# Patient Record
Sex: Male | Born: 1970 | Race: White | Hispanic: No | Marital: Married | Smoking: Current every day smoker
Health system: Southern US, Community
[De-identification: ages and names within clinical notes are randomized; demographics above are authoritative.]

## PROBLEM LIST (undated history)

## (undated) DIAGNOSIS — G4730 Sleep apnea, unspecified: Secondary | ICD-10-CM

## (undated) DIAGNOSIS — E119 Type 2 diabetes mellitus without complications: Secondary | ICD-10-CM

## (undated) DIAGNOSIS — F32A Depression, unspecified: Secondary | ICD-10-CM

## (undated) DIAGNOSIS — F419 Anxiety disorder, unspecified: Secondary | ICD-10-CM

## (undated) HISTORY — PX: COLONOSCOPY: SHX174

## (undated) HISTORY — PX: HAND SURGERY: SHX662

## (undated) HISTORY — PX: KNEE SURGERY: SHX244

## (undated) HISTORY — PX: COLECTOMY: SHX59

---

## 2001-09-28 DIAGNOSIS — G4733 Obstructive sleep apnea (adult) (pediatric): Secondary | ICD-10-CM | POA: Insufficient documentation

## 2004-10-22 DIAGNOSIS — M8580 Other specified disorders of bone density and structure, unspecified site: Secondary | ICD-10-CM | POA: Insufficient documentation

## 2005-12-06 ENCOUNTER — Encounter: Admission: RE | Admit: 2005-12-06 | Discharge: 2005-12-06 | Payer: Self-pay | Admitting: Occupational Medicine

## 2006-09-26 DIAGNOSIS — K5090 Crohn's disease, unspecified, without complications: Secondary | ICD-10-CM

## 2006-09-26 HISTORY — DX: Crohn's disease, unspecified, without complications: K50.90

## 2011-10-20 DIAGNOSIS — K5090 Crohn's disease, unspecified, without complications: Secondary | ICD-10-CM | POA: Insufficient documentation

## 2014-02-24 DIAGNOSIS — E8881 Metabolic syndrome: Secondary | ICD-10-CM | POA: Insufficient documentation

## 2014-02-24 DIAGNOSIS — M5030 Other cervical disc degeneration, unspecified cervical region: Secondary | ICD-10-CM | POA: Insufficient documentation

## 2015-09-23 DIAGNOSIS — E119 Type 2 diabetes mellitus without complications: Secondary | ICD-10-CM | POA: Insufficient documentation

## 2015-09-23 DIAGNOSIS — Z794 Long term (current) use of insulin: Secondary | ICD-10-CM | POA: Insufficient documentation

## 2017-04-13 DIAGNOSIS — K760 Fatty (change of) liver, not elsewhere classified: Secondary | ICD-10-CM | POA: Insufficient documentation

## 2017-04-13 DIAGNOSIS — K7460 Unspecified cirrhosis of liver: Secondary | ICD-10-CM | POA: Insufficient documentation

## 2017-05-01 DIAGNOSIS — K3189 Other diseases of stomach and duodenum: Secondary | ICD-10-CM | POA: Insufficient documentation

## 2017-09-18 DIAGNOSIS — E785 Hyperlipidemia, unspecified: Secondary | ICD-10-CM | POA: Insufficient documentation

## 2018-09-26 HISTORY — PX: CYSTOSCOPY: SUR368

## 2018-10-03 DIAGNOSIS — D3615 Benign neoplasm of peripheral nerves and autonomic nervous system of abdomen: Secondary | ICD-10-CM | POA: Insufficient documentation

## 2019-04-07 DIAGNOSIS — R162 Hepatomegaly with splenomegaly, not elsewhere classified: Secondary | ICD-10-CM | POA: Insufficient documentation

## 2019-10-23 DIAGNOSIS — M5412 Radiculopathy, cervical region: Secondary | ICD-10-CM | POA: Insufficient documentation

## 2020-04-09 DIAGNOSIS — I422 Other hypertrophic cardiomyopathy: Secondary | ICD-10-CM | POA: Insufficient documentation

## 2021-07-13 ENCOUNTER — Ambulatory Visit (INDEPENDENT_AMBULATORY_CARE_PROVIDER_SITE_OTHER): Payer: Medicare HMO | Admitting: Urology

## 2021-07-13 ENCOUNTER — Other Ambulatory Visit: Payer: Self-pay

## 2021-07-13 ENCOUNTER — Ambulatory Visit (HOSPITAL_COMMUNITY)
Admission: RE | Admit: 2021-07-13 | Discharge: 2021-07-13 | Disposition: A | Discharge status: Home / Self Care | Payer: Medicare HMO | Source: Ambulatory Visit | Attending: Urology | Admitting: Urology

## 2021-07-13 ENCOUNTER — Encounter: Payer: Self-pay | Admitting: Urology

## 2021-07-13 VITALS — BP 136/81 | HR 73 | Wt 220.0 lb

## 2021-07-13 DIAGNOSIS — N200 Calculus of kidney: Secondary | ICD-10-CM

## 2021-07-13 LAB — URINALYSIS, ROUTINE W REFLEX MICROSCOPIC
Bilirubin, UA: NEGATIVE
Ketones, UA: NEGATIVE
Leukocytes,UA: NEGATIVE
Nitrite, UA: NEGATIVE
Protein,UA: NEGATIVE
Specific Gravity, UA: 1.015 (ref 1.005–1.030)
Urobilinogen, Ur: 1 mg/dL (ref 0.2–1.0)
pH, UA: 7 (ref 5.0–7.5)

## 2021-07-13 LAB — MICROSCOPIC EXAMINATION
Bacteria, UA: NONE SEEN
Epithelial Cells (non renal): NONE SEEN /hpf (ref 0–10)
RBC, Urine: >30 /hpf — AB (ref 0–2)
Renal Epithel, UA: NONE SEEN /hpf
WBC, UA: NONE SEEN /hpf (ref 0–5)

## 2021-07-13 MED ORDER — TAMSULOSIN HCL 0.4 MG PO CAPS
0.4000 mg | ORAL_CAPSULE | Freq: Every day | ORAL | 11 refills | Status: DC
Start: 2021-07-13 — End: 2021-11-10

## 2021-07-13 NOTE — H&P (View-Only) (Signed)
 07/13/2021 1:58 PM   Thomas Alexander 04/24/1971 [id number]  Referring provider: No referring provider defined for this encounter.  nephrolithiasis   HPI: Thomas Alexander is 50yo here for evaluation of nephrolithiasis. He has been makes stones for the past 4-5 years. He was seen at [facility] and was started on potassium citrate and magnesium. This has not decreased the stone formation rate. He passes 2-3 stones per week. He has intermittent bilateral flank pain. NO fevers. IPSS 13 QOL 6. He takes flomax intermittently. KUB from today shows numerous bilateral renal calculi and multiple distal right ureteral calculi.    PMH: No past medical history on file.  Surgical History:   Home Medications:  Allergies as of 07/13/2021       Reactions   Nsaids Other (See Comments)   Crohns Disease; takes Humira   Fish Oil Other (See Comments)   Other Reaction: BURNING IN THROAT, STOMACH        Medication List        Accurate as of July 13, 2021  1:58 PM. If you have any questions, ask your nurse or doctor.          atenolol 50 MG tablet Commonly known as: TENORMIN Take by mouth.   calcium carbonate 1250 (500 Ca) MG chewable tablet Commonly known as: OS-CAL Chew by mouth.   citalopram 20 MG tablet Commonly known as: CELEXA citalopram 20 mg tablet  Take 1 tablet every day by oral route.   cyanocobalamin 1000 MCG/ML injection Commonly known as: (VITAMIN B-12) Inject 1,000 mcg into the muscle every 30 (thirty) days.   dicyclomine 10 MG capsule Commonly known as: BENTYL Take by mouth.   Humira Pen 40 MG/0.4ML Pnkt Generic drug: Adalimumab Humira(CF) Pen 40 mg/0.4 mL subcutaneous kit   Jardiance 25 MG Tabs tablet Generic drug: empagliflozin Take 25 mg by mouth every morning.   lovastatin 10 MG tablet Commonly known as: MEVACOR Take 10 mg by mouth at bedtime.   TAMSULOSIN HCL PO Take by mouth.   UltiCare Tuberculin Safety Syr 25G X 5/8" 1 ML Misc Generic  drug: TUBERCULIN SYR 1CC/25GX5/8"        Allergies:  Allergies  Allergen Reactions   Nsaids Other (See Comments)    Crohns Disease; takes Humira   Fish Oil Other (See Comments)    Other Reaction: BURNING IN THROAT, STOMACH    Family History: No family history on file.  Social History:  has no history on file for tobacco use, alcohol use, and drug use.  ROS: All other review of systems were reviewed and are negative except what is noted above in HPI  Physical Exam: BP 136/81   Pulse 73   Wt 220 lb (99.8 kg)   Constitutional:  Alert and oriented, No acute distress. HEENT: [place] AT, moist mucus membranes.  Trachea midline, no masses. Cardiovascular: No clubbing, cyanosis, or edema. Respiratory: Normal respiratory effort, no increased work of breathing. GI: Abdomen is soft, nontender, nondistended, no abdominal masses GU: No CVA tenderness.  Lymph: No cervical or inguinal lymphadenopathy. Skin: No rashes, bruises or suspicious lesions. Neurologic: Grossly intact, no focal deficits, moving all 4 extremities. Psychiatric: Normal mood and affect.  Laboratory Data: No results found for: WBC, HGB, HCT, MCV, PLT  No results found for: CREATININE  No results found for: PSA  No results found for: TESTOSTERONE  No results found for: HGBA1C  Urinalysis No results found for: COLORURINE, APPEARANCEUR, LABSPEC, PHURINE, GLUCOSEU, HGBUR, BILIRUBINUR, KETONESUR, PROTEINUR, UROBILINOGEN, NITRITE, LEUKOCYTESUR    No results found for: LABMICR, [place], RBCUA, LABEPIT, MUCUS, BACTERIA  Pertinent Imaging: KUB today: Images reviewed and discussed with the patient No results found for this or any previous visit.  No results found for this or any previous visit.  No results found for this or any previous visit.  No results found for this or any previous visit.  No results found for this or any previous visit.  No results found for this or any previous visit.  No results found for  this or any previous visit.  No results found for this or any previous visit.   Assessment & Plan:    1. Kidney stones -We discussed the management of kidney stones. These options include observation, ureteroscopy, shockwave lithotripsy (ESWL) and percutaneous nephrolithotomy (PCNL). We discussed which options are relevant to the patient's stone(s). We discussed the natural history of kidney stones as well as the complications of untreated stones and the impact on quality of life without treatment as well as with each of the above listed treatments. We also discussed the efficacy of each treatment in its ability to clear the stone burden. With any of these management options I discussed the signs and symptoms of infection and the need for emergent treatment should these be experienced. For each option we discussed the ability of each procedure to clear the patient of their stone burden.   For observation I described the risks which include but are not limited to silent renal damage, life-threatening infection, need for emergent surgery, failure to pass stone and pain.   For ureteroscopy I described the risks which include bleeding, infection, damage to contiguous structures, positioning injury, ureteral stricture, ureteral avulsion, ureteral injury, need for prolonged ureteral stent, inability to perform ureteroscopy, need for an interval procedure, inability to clear stone burden, stent discomfort/pain, heart attack, stroke, pulmonary embolus and the inherent risks with general anesthesia.   For shockwave lithotripsy I described the risks which include arrhythmia, kidney contusion, kidney hemorrhage, need for transfusion, pain, inability to adequately break up stone, inability to pass stone fragments, Steinstrasse, infection associated with obstructing stones, need for alternate surgical procedure, need for repeat shockwave lithotripsy, MI, CVA, PE and the inherent risks with anesthesia/conscious  sedation.   For PCNL I described the risks including positioning injury, pneumothorax, hydrothorax, need for chest tube, inability to clear stone burden, renal laceration, arterial venous fistula or malformation, need for embolization of kidney, loss of kidney or renal function, need for repeat procedure, need for prolonged nephrostomy tube, ureteral avulsion, MI, CVA, PE and the inherent risks of general anesthesia.   - The patient would like to proceed with Right ureteroscopic stone extraction and then staged left ureteroscopic stone extraction. - Urinalysis, Routine w reflex microscopic   No follow-ups on file.  Nicolette Bang, MD  [facility] [place]

## 2021-07-13 NOTE — Patient Instructions (Signed)
Ureteroscopy Ureteroscopy is a procedure to check for and treat problems inside part of the urinary tract. In this procedure, a thin, flexible tube with a light at the end (ureteroscope) is used to look at the inside of the kidneys and the ureters. The ureters are the tubes that carry urine from the kidneys to the bladder. The ureteroscope isinserted into one or both of the ureters. You may need this procedure if you have frequent urinary tract infections (UTIs), blood in your urine, or a stone in one of your ureters. A ureteroscopy can be done: To find the cause of urine blockage in a ureter and to evaluate other abnormalities inside the ureters or kidneys. To remove stones. To remove or treat growths of tissue (polyps), abnormal tissue, and some types of tumors. To remove a tissue sample and check it for disease under a microscope (biopsy). Tell a health care provider about: Any allergies you have. All medicines you are taking, including vitamins, herbs, eye drops, creams, and over-the-counter medicines. Any problems you or family members have had with anesthetic medicines. Any blood disorders you have. Any surgeries you have had. Any medical conditions you have. Whether you are pregnant or may be pregnant. What are the risks? Generally, this is a safe procedure. However, problems may occur, including: Bleeding. Infection. Allergic reactions to medicines. Scarring that narrows the ureter (stricture). Creating a hole in the ureter (perforation). What happens before the procedure? Staying hydrated Follow instructions from your health care provider about hydration, which may include: Up to 2 hours before the procedure - you may continue to drink clear liquids, such as water, clear fruit juice, black coffee, and plain tea.  Eating and drinking restrictions Follow instructions from your health care provider about eating and drinking, which may include: 8 hours before the procedure - stop  eating heavy meals or foods, such as meat, fried foods, or fatty foods. 6 hours before the procedure - stop eating light meals or foods, such as toast or cereal. 6 hours before the procedure - stop drinking milk or drinks that contain milk. 2 hours before the procedure - stop drinking clear liquids. Medicines Ask your health care provider about: Changing or stopping your regular medicines. This is especially important if you are taking diabetes medicines or blood thinners. Taking medicines such as aspirin and ibuprofen. These medicines can thin your blood. Do not take these medicines unless your health care provider tells you to take them. Taking over-the-counter medicines, vitamins, herbs, and supplements. General instructions Do not use any products that contain nicotine or tobacco for at least 4 weeks before the procedure. These products include cigarettes, e-cigarettes, and chewing tobacco. If you need help quitting, ask your health care provider. You may have a urine sample taken to check for infection. Plan to have someone take you home from the hospital or clinic. If you will be going home right after the procedure, plan to have someone with you for 24 hours. Ask your health care provider what steps will be taken to help prevent infection. These may include: Washing skin with a germ-killing soap. Receiving antibiotic medicine. What happens during the procedure?  An IV will be inserted into one of your veins. You will be given one or more of the following: A medicine to help you relax (sedative). A medicine to make you fall asleep (general anesthetic). A medicine that is injected into your spine to numb the area below and slightly above the injection site (spinal anesthetic). The part   of your body that drains urine from your bladder (urethra) will be cleaned with a germ-killing solution. The ureteroscope will be passed through your urethra into your bladder. A salt-water solution will  be sent through the ureteroscope to fill your bladder. This will help the health care provider see the openings of your ureters more clearly. The ureteroscope will be passed into your ureter. If a growth is found, a biopsy may be done. If a stone is found, it may be removed through the ureteroscope, or the stone may be broken up using a laser, shock waves, or electrical energy. In some cases, if the ureter is too small, a tube may be inserted that keeps the ureter open (ureteral stent). The stent may be left in place for 1 or 2 weeks to keep the ureter open, and then the ureteroscopy procedure will be done. The scope will be removed, and your bladder will be emptied. The procedure may vary among health care providers and hospitals. What can I expect after the procedure? After your procedure, it is common to have: Your blood pressure, heart rate, breathing rate, and blood oxygen level monitored until you leave the hospital or clinic. A burning sensation when you urinate. You may be asked to urinate. Blood in your urine. Mild discomfort in your bladder area or kidney area when urinating. A need to urinate more often or urgently. Follow these instructions at home: Medicines Take over-the-counter and prescription medicines only as told by your health care provider. If you were prescribed an antibiotic medicine, take it as told by your health care provider. Do not stop taking the antibiotic even if you start to feel better. General instructions  If you were given a sedative during the procedure, it can affect you for several hours. Do not drive or operate machinery until your health care provider says that it is safe. To relieve burning, take a warm bath or hold a warm washcloth over your groin. Drink enough fluid to keep your urine pale yellow. Drink two 8-ounce (237 mL) glasses of water every hour for the first 2 hours after you get home. Continue to drink water often at home. You can eat what  you normally do. Keep all follow-up visits as told by your health care provider. This is important. If you had a ureteral stent placed, ask your health care provider when you need to return to have it removed.  Contact a health care provider if you have: Chills or a fever. Burning pain for longer than 24 hours after the procedure. Blood in your urine for longer than 24 hours after the procedure. Get help right away if you have: Large amounts of blood in your urine. Blood clots in your urine. Severe pain. Chest pain or trouble breathing. The feeling of a full bladder and you are unable to urinate. These symptoms may represent a serious problem that is an emergency. Do not wait to see if the symptoms will go away. Get medical help right away. Call your local emergency services (911 in the [place] Summary Ureteroscopy is a procedure to check for and treat problems inside part of the urinary tract. In this procedure, a thin, flexible tube with a light at the end (ureteroscope) is used to look at the inside of the kidneys and the ureters. You may need this procedure if you have frequent urinary tract infections (UTIs), blood in your urine, or a stone in a ureter. This information is not intended to replace advice given to   you by your health care provider. Make sure you discuss any questions you have with your healthcare provider. Document Revised: 06/19/2019 Document Reviewed: 06/19/2019 Elsevier Patient Education  2022 [employer] Inc.  

## 2021-07-13 NOTE — Progress Notes (Signed)
07/13/2021 1:58 PM   Thomas Alexander May 25, 1971 [id number]  Referring provider: No referring provider defined for this encounter.  nephrolithiasis   HPI: Thomas Alexander is 50yo here for evaluation of nephrolithiasis. He has been makes stones for the past 4-5 years. He was seen at [facility] and was started on potassium citrate and magnesium. This has not decreased the stone formation rate. He passes 2-3 stones per week. He has intermittent bilateral flank pain. NO fevers. IPSS 13 QOL 6. He takes flomax intermittently. KUB from today shows numerous bilateral renal calculi and multiple distal right ureteral calculi.    PMH: No past medical history on file.  Surgical History:   Home Medications:  Allergies as of 07/13/2021       Reactions   Nsaids Other (See Comments)   Crohns Disease; takes Humira   Fish Oil Other (See Comments)   Other Reaction: BURNING IN THROAT, STOMACH        Medication List        Accurate as of July 13, 2021  1:58 PM. If you have any questions, ask your nurse or doctor.          atenolol 50 MG tablet Commonly known as: TENORMIN Take by mouth.   calcium carbonate 1250 (500 Ca) MG chewable tablet Commonly known as: OS-CAL Chew by mouth.   citalopram 20 MG tablet Commonly known as: CELEXA citalopram 20 mg tablet  Take 1 tablet every day by oral route.   cyanocobalamin 1000 MCG/ML injection Commonly known as: (VITAMIN B-12) Inject 1,000 mcg into the muscle every 30 (thirty) days.   dicyclomine 10 MG capsule Commonly known as: BENTYL Take by mouth.   Humira Pen 40 MG/0.4ML Pnkt Generic drug: Adalimumab Humira(CF) Pen 40 mg/0.4 mL subcutaneous kit   Jardiance 25 MG Tabs tablet Generic drug: empagliflozin Take 25 mg by mouth every morning.   lovastatin 10 MG tablet Commonly known as: MEVACOR Take 10 mg by mouth at bedtime.   TAMSULOSIN HCL PO Take by mouth.   UltiCare Tuberculin Safety Syr 25G X 5/8" 1 ML Misc Generic  drug: TUBERCULIN SYR 1CC/25GX5/8"        Allergies:  Allergies  Allergen Reactions   Nsaids Other (See Comments)    Crohns Disease; takes Humira   Fish Oil Other (See Comments)    Other Reaction: BURNING IN THROAT, STOMACH    Family History: No family history on file.  Social History:  has no history on file for tobacco use, alcohol use, and drug use.  ROS: All other review of systems were reviewed and are negative except what is noted above in HPI  Physical Exam: BP 136/81   Pulse 73   Wt 220 lb (99.8 kg)   Constitutional:  Alert and oriented, No acute distress. HEENT: [place] AT, moist mucus membranes.  Trachea midline, no masses. Cardiovascular: No clubbing, cyanosis, or edema. Respiratory: Normal respiratory effort, no increased work of breathing. GI: Abdomen is soft, nontender, nondistended, no abdominal masses GU: No CVA tenderness.  Lymph: No cervical or inguinal lymphadenopathy. Skin: No rashes, bruises or suspicious lesions. Neurologic: Grossly intact, no focal deficits, moving all 4 extremities. Psychiatric: Normal mood and affect.  Laboratory Data: No results found for: WBC, HGB, HCT, MCV, PLT  No results found for: CREATININE  No results found for: PSA  No results found for: TESTOSTERONE  No results found for: HGBA1C  Urinalysis No results found for: COLORURINE, APPEARANCEUR, LABSPEC, [place], [place], [place], [place], KETONESUR, PROTEINUR, [place], NITRITE, LEUKOCYTESUR  No results found for: LABMICR, [place], RBCUA, LABEPIT, MUCUS, BACTERIA  Pertinent Imaging: KUB today: Images reviewed and discussed with the patient No results found for this or any previous visit.  No results found for this or any previous visit.  No results found for this or any previous visit.  No results found for this or any previous visit.  No results found for this or any previous visit.  No results found for this or any previous visit.  No results found for  this or any previous visit.  No results found for this or any previous visit.   Assessment & Plan:    1. Kidney stones -We discussed the management of kidney stones. These options include observation, ureteroscopy, shockwave lithotripsy (ESWL) and percutaneous nephrolithotomy (PCNL). We discussed which options are relevant to the patient's stone(s). We discussed the natural history of kidney stones as well as the complications of untreated stones and the impact on quality of life without treatment as well as with each of the above listed treatments. We also discussed the efficacy of each treatment in its ability to clear the stone burden. With any of these management options I discussed the signs and symptoms of infection and the need for emergent treatment should these be experienced. For each option we discussed the ability of each procedure to clear the patient of their stone burden.   For observation I described the risks which include but are not limited to silent renal damage, life-threatening infection, need for emergent surgery, failure to pass stone and pain.   For ureteroscopy I described the risks which include bleeding, infection, damage to contiguous structures, positioning injury, ureteral stricture, ureteral avulsion, ureteral injury, need for prolonged ureteral stent, inability to perform ureteroscopy, need for an interval procedure, inability to clear stone burden, stent discomfort/pain, heart attack, stroke, pulmonary embolus and the inherent risks with general anesthesia.   For shockwave lithotripsy I described the risks which include arrhythmia, kidney contusion, kidney hemorrhage, need for transfusion, pain, inability to adequately break up stone, inability to pass stone fragments, Steinstrasse, infection associated with obstructing stones, need for alternate surgical procedure, need for repeat shockwave lithotripsy, MI, CVA, PE and the inherent risks with anesthesia/conscious  sedation.   For PCNL I described the risks including positioning injury, pneumothorax, hydrothorax, need for chest tube, inability to clear stone burden, renal laceration, arterial venous fistula or malformation, need for embolization of kidney, loss of kidney or renal function, need for repeat procedure, need for prolonged nephrostomy tube, ureteral avulsion, MI, CVA, PE and the inherent risks of general anesthesia.   - The patient would like to proceed with Right ureteroscopic stone extraction and then staged left ureteroscopic stone extraction. - Urinalysis, Routine w reflex microscopic   No follow-ups on file.  Nicolette Bang, MD  [facility] [place]

## 2021-07-13 NOTE — H&P (View-Only) (Signed)
 07/13/2021 1:58 PM   Thomas Alexander 07/10/1971 [id number]  Referring provider: No referring provider defined for this encounter.  nephrolithiasis   HPI: Thomas Alexander is 50yo here for evaluation of nephrolithiasis. He has been makes stones for the past 4-5 years. He was seen at [facility] and was started on potassium citrate and magnesium. This has not decreased the stone formation rate. He passes 2-3 stones per week. He has intermittent bilateral flank pain. NO fevers. IPSS 13 QOL 6. He takes flomax intermittently. KUB from today shows numerous bilateral renal calculi and multiple distal right ureteral calculi.    PMH: No past medical history on file.  Surgical History:   Home Medications:  Allergies as of 07/13/2021       Reactions   Nsaids Other (See Comments)   Crohns Disease; takes Humira   Fish Oil Other (See Comments)   Other Reaction: BURNING IN THROAT, STOMACH        Medication List        Accurate as of July 13, 2021  1:58 PM. If you have any questions, ask your nurse or doctor.          atenolol 50 MG tablet Commonly known as: TENORMIN Take by mouth.   calcium carbonate 1250 (500 Ca) MG chewable tablet Commonly known as: OS-CAL Chew by mouth.   citalopram 20 MG tablet Commonly known as: CELEXA citalopram 20 mg tablet  Take 1 tablet every day by oral route.   cyanocobalamin 1000 MCG/ML injection Commonly known as: (VITAMIN B-12) Inject 1,000 mcg into the muscle every 30 (thirty) days.   dicyclomine 10 MG capsule Commonly known as: BENTYL Take by mouth.   Humira Pen 40 MG/0.4ML Pnkt Generic drug: Adalimumab Humira(CF) Pen 40 mg/0.4 mL subcutaneous kit   Jardiance 25 MG Tabs tablet Generic drug: empagliflozin Take 25 mg by mouth every morning.   lovastatin 10 MG tablet Commonly known as: MEVACOR Take 10 mg by mouth at bedtime.   TAMSULOSIN HCL PO Take by mouth.   UltiCare Tuberculin Safety Syr 25G X 5/8" 1 ML Misc Generic  drug: TUBERCULIN SYR 1CC/25GX5/8"        Allergies:  Allergies  Allergen Reactions   Nsaids Other (See Comments)    Crohns Disease; takes Humira   Fish Oil Other (See Comments)    Other Reaction: BURNING IN THROAT, STOMACH    Family History: No family history on file.  Social History:  has no history on file for tobacco use, alcohol use, and drug use.  ROS: All other review of systems were reviewed and are negative except what is noted above in HPI  Physical Exam: BP 136/81   Pulse 73   Wt 220 lb (99.8 kg)   Constitutional:  Alert and oriented, No acute distress. HEENT: [place] AT, moist mucus membranes.  Trachea midline, no masses. Cardiovascular: No clubbing, cyanosis, or edema. Respiratory: Normal respiratory effort, no increased work of breathing. GI: Abdomen is soft, nontender, nondistended, no abdominal masses GU: No CVA tenderness.  Lymph: No cervical or inguinal lymphadenopathy. Skin: No rashes, bruises or suspicious lesions. Neurologic: Grossly intact, no focal deficits, moving all 4 extremities. Psychiatric: Normal mood and affect.  Laboratory Data: No results found for: WBC, HGB, HCT, MCV, PLT  No results found for: CREATININE  No results found for: PSA  No results found for: TESTOSTERONE  No results found for: HGBA1C  Urinalysis No results found for: COLORURINE, APPEARANCEUR, LABSPEC, PHURINE, GLUCOSEU, HGBUR, BILIRUBINUR, KETONESUR, PROTEINUR, UROBILINOGEN, NITRITE, LEUKOCYTESUR    No results found for: LABMICR, [place], RBCUA, LABEPIT, MUCUS, BACTERIA  Pertinent Imaging: KUB today: Images reviewed and discussed with the patient No results found for this or any previous visit.  No results found for this or any previous visit.  No results found for this or any previous visit.  No results found for this or any previous visit.  No results found for this or any previous visit.  No results found for this or any previous visit.  No results found for  this or any previous visit.  No results found for this or any previous visit.   Assessment & Plan:    1. Kidney stones -We discussed the management of kidney stones. These options include observation, ureteroscopy, shockwave lithotripsy (ESWL) and percutaneous nephrolithotomy (PCNL). We discussed which options are relevant to the patient's stone(s). We discussed the natural history of kidney stones as well as the complications of untreated stones and the impact on quality of life without treatment as well as with each of the above listed treatments. We also discussed the efficacy of each treatment in its ability to clear the stone burden. With any of these management options I discussed the signs and symptoms of infection and the need for emergent treatment should these be experienced. For each option we discussed the ability of each procedure to clear the patient of their stone burden.   For observation I described the risks which include but are not limited to silent renal damage, life-threatening infection, need for emergent surgery, failure to pass stone and pain.   For ureteroscopy I described the risks which include bleeding, infection, damage to contiguous structures, positioning injury, ureteral stricture, ureteral avulsion, ureteral injury, need for prolonged ureteral stent, inability to perform ureteroscopy, need for an interval procedure, inability to clear stone burden, stent discomfort/pain, heart attack, stroke, pulmonary embolus and the inherent risks with general anesthesia.   For shockwave lithotripsy I described the risks which include arrhythmia, kidney contusion, kidney hemorrhage, need for transfusion, pain, inability to adequately break up stone, inability to pass stone fragments, Steinstr[place], infection associated with obstructing stones, need for alternate surgical procedure, need for repeat shockwave lithotripsy, MI, CVA, PE and the inherent risks with anesthesia/conscious  sedation.   For PCNL I described the risks including positioning injury, pneumothorax, hydrothorax, need for chest tube, inability to clear stone burden, renal laceration, arterial venous fistula or malformation, need for embolization of kidney, loss of kidney or renal function, need for repeat procedure, need for prolonged nephrostomy tube, ureteral avulsion, MI, CVA, PE and the inherent risks of general anesthesia.   - The patient would like to proceed with Right ureteroscopic stone extraction and then staged left ureteroscopic stone extraction. - Urinalysis, Routine w reflex microscopic   No follow-ups on file.  Nicolette Bang, MD  [facility] [place]

## 2021-07-13 NOTE — Progress Notes (Signed)
Urological Symptom Review  Patient is experiencing the following symptoms: Frequent urination Hard to postpone urination Burning/pain with urination Get up at night to urinate Leakage of urine Trouble starting stream Blood in urine Erection problems (male only) Kidney stones   Review of Systems  Gastrointestinal (upper)  : Nausea Vomiting  Gastrointestinal (lower) : Diarrhea Constipation  Constitutional : negative  Skin: Itching  Eyes: Blurred vision  Ear/Nose/Throat : Negative for Ear/Nose/Throat symptoms  Hematologic/Lymphatic: Negative for Hematologic/Lymphatic symptoms  Cardiovascular : Negative for cardiovascular symptoms  Respiratory : Negative for respiratory symptoms  Endocrine: Excessive thirst  Musculoskeletal: Back pain Joint pain  Neurological: Negative for neurological symptoms  Psychologic: Negative for psychiatric symptoms

## 2021-07-14 ENCOUNTER — Encounter (HOSPITAL_COMMUNITY)
Admission: RE | Admit: 2021-07-14 | Discharge: 2021-07-14 | Disposition: A | Discharge status: Home / Self Care | Payer: Medicare HMO | Source: Ambulatory Visit | Attending: Urology | Admitting: Urology

## 2021-07-14 ENCOUNTER — Encounter (HOSPITAL_COMMUNITY): Payer: Self-pay

## 2021-07-14 HISTORY — DX: Sleep apnea, unspecified: G47.30

## 2021-07-14 HISTORY — DX: Type 2 diabetes mellitus without complications: E11.9

## 2021-07-14 HISTORY — DX: Depression, unspecified: F32.A

## 2021-07-14 HISTORY — DX: Anxiety disorder, unspecified: F41.9

## 2021-07-14 LAB — CBC
Hematocrit: 42.2 % (ref 37.5–51.0)
Hemoglobin: 14.6 g/dL (ref 13.0–17.7)
MCH: 34.2 pg — ABNORMAL HIGH (ref 26.6–33.0)
MCHC: 34.6 g/dL (ref 31.5–35.7)
MCV: 99 fL — ABNORMAL HIGH (ref 79–97)
Platelets: 81 10*3/uL — CL (ref 150–450)
RBC: 4.27 x10E6/uL (ref 4.14–5.80)
RDW: 12.3 % (ref 11.6–15.4)
WBC: 5.1 10*3/uL (ref 3.4–10.8)

## 2021-07-14 LAB — BASIC METABOLIC PANEL
BUN/Creatinine Ratio: 8 — ABNORMAL LOW (ref 9–20)
BUN: 7 mg/dL (ref 6–24)
CO2: 22 mmol/L (ref 20–29)
Calcium: 9.3 mg/dL (ref 8.7–10.2)
Chloride: 104 mmol/L (ref 96–106)
Creatinine, Ser: 0.88 mg/dL (ref 0.76–1.27)
Glucose: 233 mg/dL — ABNORMAL HIGH (ref 70–99)
Potassium: 4 mmol/L (ref 3.5–5.2)
Sodium: 142 mmol/L (ref 134–144)
eGFR: 103 mL/min/{1.73_m2} (ref >59)

## 2021-07-14 NOTE — Patient Instructions (Signed)
Thomas Alexander  07/14/2021     @PREFPERIOPPHARMACY @   Your procedure is scheduled on 07/15/2021.  Report to 07/17/2021 at 6:15 A.M.  Call this number if you have problems the morning of surgery: [phone]  [phone]   Remember:  Do not eat or drink after midnight.   Take these medicines the morning of surgery with A SIP OF WATER : Flomax and Celexa    Do not wear jewelry, make-up or nail polish.  Do not wear lotions, powders, or perfumes, or deodorant.  Do not shave 48 hours prior to surgery.  Men may shave face and neck.  Do not bring valuables to the hospital.  [facility] is not responsible for any belongings or valuables.  Contacts, dentures or bridgework may not be worn into surgery.  Leave your suitcase in the car.  After surgery it may be brought to your room.  For patients admitted to the hospital, discharge time will be determined by your treatment team.  Patients discharged the day of surgery will not be allowed to drive home.   Name and phone number of your driver:   family Special instructions:  n/a  Please read over the following fact sheets that you were given. Care and Recovery After Surgery  Cystoscopy Cystoscopy is a procedure that is used to help diagnose and sometimes treat conditions that affect the lower urinary tract. The lower urinary tract includes the bladder and the urethra. The urethra is the tube that drains urine from the bladder. Cystoscopy is done using a thin, tube-shaped instrument with a light and camera at the end (cystoscope). The cystoscope may be hard or flexible, depending on the goal of the procedure. The cystoscope is inserted through the urethra, into the bladder. Cystoscopy may be recommended if you have: Urinary tract infections that keep coming back. Blood in the urine (hematuria). An inability to control when you urinate (urinary incontinence) or an overactive bladder. Unusual cells found in a urine sample. A  blockage in the urethra, such as a urinary stone. Painful urination. An abnormality in the bladder found during an intravenous pyelogram (IVP) or CT scan. Cystoscopy may also be done to remove a sample of tissue to be examined under a microscope (biopsy). Tell a health care provider about: Any allergies you have. All medicines you are taking, including vitamins, herbs, eye drops, creams, and over-the-counter medicines. Any problems you or family members have had with anesthetic medicines. Any blood disorders you have. Any surgeries you have had. Any medical conditions you have. Whether you are pregnant or may be pregnant. What are the risks? Generally, this is a safe procedure. However, problems may occur, including: Infection. Bleeding. Allergic reactions to medicines. Damage to other structures or organs. What happens before the procedure? Medicines Ask your health care provider about: Changing or stopping your regular medicines. This is especially important if you are taking diabetes medicines or blood thinners. Taking medicines such as aspirin and ibuprofen. These medicines can thin your blood. Do not take these medicines unless your health care provider tells you to take them. Taking over-the-counter medicines, vitamins, herbs, and supplements. Tests You may have an exam or testing, such as: X-rays of the bladder, urethra, or kidneys. CT scan of the abdomen or pelvis. Urine tests to check for signs of infection. General instructions Follow instructions from your health care provider about eating or drinking restrictions. Ask your health care provider what steps will be taken to help prevent infection. These steps  may include: Washing skin with a germ-killing soap. Taking antibiotic medicine. Plan to have a responsible adult take you home from the hospital or clinic. What happens during the procedure?  You will be given one or more of the following: A medicine to help you  relax (sedative). A medicine to numb the area (local anesthetic). The area around the opening of your urethra will be cleaned. The cystoscope will be passed through your urethra into your bladder. Germ-free (sterile) fluid will flow through the cystoscope to fill your bladder. The fluid will stretch your bladder so that your health care provider can clearly examine your bladder walls. Your doctor will look at the urethra and bladder. Your doctor may take a biopsy or remove stones. The cystoscope will be removed, and your bladder will be emptied. The procedure may vary among health care providers and hospitals. What can I expect after the procedure? After the procedure, it is common to have: Some soreness or pain in your abdomen and urethra. Urinary symptoms. These include: Mild pain or burning when you urinate. Pain should stop within a few minutes after you urinate. This may last for up to 1 week. A small amount of blood in your urine for several days. Feeling like you need to urinate but producing only a small amount of urine. Follow these instructions at home: Medicines Take over-the-counter and prescription medicines only as told by your health care provider. If you were prescribed an antibiotic medicine, take it as told by your health care provider. Do not stop taking the antibiotic even if you start to feel better. General instructions Return to your normal activities as told by your health care provider. Ask your health care provider what activities are safe for you. If you were given a sedative during the procedure, it can affect you for several hours. Do not drive or operate machinery until your health care provider says that it is safe. Watch for any blood in your urine. If the amount of blood in your urine increases, call your health care provider. Follow instructions from your health care provider about eating or drinking restrictions. If a tissue sample was removed for testing  (biopsy) during your procedure, it is up to you to get your test results. Ask your health care provider, or the department that is doing the test, when your results will be ready. Drink enough fluid to keep your urine pale yellow. Keep all follow-up visits. This is important. Contact a health care provider if: You have pain that gets worse or does not get better with medicine, especially pain when you urinate. You have trouble urinating. You have more blood in your urine. Get help right away if: You have blood clots in your urine. You have abdominal pain. You have a fever or chills. You are unable to urinate. Summary Cystoscopy is a procedure that is used to help diagnose and sometimes treat conditions that affect the lower urinary tract. Cystoscopy is done using a thin, tube-shaped instrument with a light and camera at the end. After the procedure, it is common to have some soreness or pain in your abdomen and urethra. Watch for any blood in your urine. If the amount of blood in your urine increases, call your health care provider. If you were prescribed an antibiotic medicine, take it as told by your health care provider. Do not stop taking the antibiotic even if you start to feel better. This information is not intended to replace advice given to you by  your health care provider. Make sure you discuss any questions you have with your health care provider. Document Revised: 04/24/2020 Document Reviewed: 04/24/2020 Elsevier Patient Education  2022 [employer].

## 2021-07-15 ENCOUNTER — Ambulatory Visit (HOSPITAL_COMMUNITY): Payer: Medicare HMO

## 2021-07-15 ENCOUNTER — Ambulatory Visit (HOSPITAL_COMMUNITY): Payer: Medicare HMO | Admitting: Anesthesiology

## 2021-07-15 ENCOUNTER — Encounter (HOSPITAL_COMMUNITY)
Admission: RE | Disposition: A | Discharge status: Home / Self Care | Payer: Self-pay | Source: Home / Self Care | Attending: Urology

## 2021-07-15 ENCOUNTER — Ambulatory Visit (HOSPITAL_COMMUNITY)
Admission: RE | Admit: 2021-07-15 | Discharge: 2021-07-15 | Disposition: A | Discharge status: Home / Self Care | Payer: Medicare HMO | Attending: Urology | Admitting: Urology

## 2021-07-15 ENCOUNTER — Encounter (HOSPITAL_COMMUNITY): Payer: Self-pay | Admitting: Urology

## 2021-07-15 DIAGNOSIS — Z886 Allergy status to analgesic agent status: Secondary | ICD-10-CM | POA: Insufficient documentation

## 2021-07-15 DIAGNOSIS — Z79899 Other long term (current) drug therapy: Secondary | ICD-10-CM | POA: Insufficient documentation

## 2021-07-15 DIAGNOSIS — Z87442 Personal history of urinary calculi: Secondary | ICD-10-CM | POA: Diagnosis not present

## 2021-07-15 DIAGNOSIS — F17200 Nicotine dependence, unspecified, uncomplicated: Secondary | ICD-10-CM | POA: Diagnosis not present

## 2021-07-15 DIAGNOSIS — N132 Hydronephrosis with renal and ureteral calculous obstruction: Secondary | ICD-10-CM | POA: Insufficient documentation

## 2021-07-15 HISTORY — PX: CYSTOSCOPY WITH RETROGRADE PYELOGRAM, URETEROSCOPY AND STENT PLACEMENT: SHX5789

## 2021-07-15 HISTORY — PX: HOLMIUM LASER APPLICATION: SHX5852

## 2021-07-15 LAB — GLUCOSE, CAPILLARY
Glucose-Capillary: 148 mg/dL — ABNORMAL HIGH (ref 70–99)
Glucose-Capillary: 122 mg/dL — ABNORMAL HIGH (ref 70–99)

## 2021-07-15 SURGERY — CYSTOURETEROSCOPY, WITH RETROGRADE PYELOGRAM AND STENT INSERTION
Anesthesia: General | Laterality: Right

## 2021-07-15 MED ORDER — ONDANSETRON 4 MG PO TBDP: 4.0000 mg | ORAL_TABLET | Freq: Four times a day (QID) | ORAL | 0 refills | Status: DC | PRN

## 2021-07-15 MED ORDER — FENTANYL CITRATE (PF) 100 MCG/2ML IJ SOLN
INTRAMUSCULAR | Status: AC
  Filled 2021-07-15: qty 2

## 2021-07-15 MED ORDER — SODIUM CHLORIDE 0.9 % IR SOLN
Status: DC | PRN
  Administered 2021-07-15 (×2): 3000 mL

## 2021-07-15 MED ORDER — MIDAZOLAM HCL 2 MG/2ML IJ SOLN
INTRAMUSCULAR | Status: AC
  Filled 2021-07-15: qty 2

## 2021-07-15 MED ORDER — DIATRIZOATE MEGLUMINE 30 % UR SOLN
URETHRAL | Status: DC | PRN
  Administered 2021-07-15: 9 mL via URETHRAL

## 2021-07-15 MED ORDER — LIDOCAINE 2% (20 MG/ML) 5 ML SYRINGE
INTRAMUSCULAR | Status: DC | PRN
  Administered 2021-07-15: 100 mg via INTRAVENOUS

## 2021-07-15 MED ORDER — OXYCODONE-ACETAMINOPHEN 5-325 MG PO TABS: 1.0000 | ORAL_TABLET | ORAL | 0 refills | Status: DC | PRN

## 2021-07-15 MED ORDER — MIDAZOLAM HCL 5 MG/5ML IJ SOLN
INTRAMUSCULAR | Status: DC | PRN
  Administered 2021-07-15: 2 mg via INTRAVENOUS

## 2021-07-15 MED ORDER — WATER FOR IRRIGATION, STERILE IR SOLN
Status: DC | PRN
  Administered 2021-07-15: 1000 mL

## 2021-07-15 MED ORDER — EPHEDRINE 5 MG/ML INJ
INTRAVENOUS | Status: AC
  Filled 2021-07-15: qty 5

## 2021-07-15 MED ORDER — EPHEDRINE SULFATE-NACL 50-0.9 MG/10ML-% IV SOSY
PREFILLED_SYRINGE | INTRAVENOUS | Status: DC | PRN
  Administered 2021-07-15: 10 mg via INTRAVENOUS
  Administered 2021-07-15: 5 mg via INTRAVENOUS

## 2021-07-15 MED ORDER — FENTANYL CITRATE (PF) 100 MCG/2ML IJ SOLN: 25.0000 ug | INTRAMUSCULAR | Status: DC | PRN

## 2021-07-15 MED ORDER — PROPOFOL 10 MG/ML IV BOLUS
INTRAVENOUS | Status: AC
  Filled 2021-07-15: qty 20

## 2021-07-15 MED ORDER — LACTATED RINGERS IV SOLN
INTRAVENOUS | Status: DC
  Administered 2021-07-15: 1000 mL via INTRAVENOUS

## 2021-07-15 MED ORDER — DEXAMETHASONE SODIUM PHOSPHATE 10 MG/ML IJ SOLN
INTRAMUSCULAR | Status: DC | PRN
  Administered 2021-07-15: 5 mg via INTRAVENOUS

## 2021-07-15 MED ORDER — DIATRIZOATE MEGLUMINE 30 % UR SOLN
URETHRAL | Status: AC
  Filled 2021-07-15: qty 100

## 2021-07-15 MED ORDER — ONDANSETRON HCL 4 MG/2ML IJ SOLN
INTRAMUSCULAR | Status: DC | PRN
  Administered 2021-07-15: 4 mg via INTRAVENOUS

## 2021-07-15 MED ORDER — DEXAMETHASONE SODIUM PHOSPHATE 10 MG/ML IJ SOLN
INTRAMUSCULAR | Status: AC
  Filled 2021-07-15: qty 1

## 2021-07-15 MED ORDER — LIDOCAINE HCL (PF) 2 % IJ SOLN
INTRAMUSCULAR | Status: AC
  Filled 2021-07-15: qty 5

## 2021-07-15 MED ORDER — CEFAZOLIN SODIUM-DEXTROSE 2-4 GM/100ML-% IV SOLN
2.0000 g | INTRAVENOUS | Status: AC
  Administered 2021-07-15: 2 g via INTRAVENOUS
  Filled 2021-07-15: qty 100

## 2021-07-15 MED ORDER — ONDANSETRON HCL 4 MG/2ML IJ SOLN: 4.0000 mg | Freq: Once | INTRAMUSCULAR | Status: DC

## 2021-07-15 MED ORDER — ORAL CARE MOUTH RINSE: 15.0000 mL | Freq: Once | OROMUCOSAL | Status: AC

## 2021-07-15 MED ORDER — FENTANYL CITRATE (PF) 100 MCG/2ML IJ SOLN
INTRAMUSCULAR | Status: DC | PRN
  Administered 2021-07-15 (×2): 25 ug via INTRAVENOUS
  Administered 2021-07-15: 50 ug via INTRAVENOUS
  Administered 2021-07-15 (×2): 25 ug via INTRAVENOUS

## 2021-07-15 MED ORDER — PROPOFOL 10 MG/ML IV BOLUS
INTRAVENOUS | Status: DC | PRN
  Administered 2021-07-15: 200 mg via INTRAVENOUS

## 2021-07-15 MED ORDER — CHLORHEXIDINE GLUCONATE 0.12 % MT SOLN
15.0000 mL | Freq: Once | OROMUCOSAL | Status: AC
  Administered 2021-07-15: 15 mL via OROMUCOSAL
  Filled 2021-07-15: qty 15

## 2021-07-15 SURGICAL SUPPLY — 26 items
BAG DRAIN URO TABLE W/ADPT NS (BAG) ×2 IMPLANT
BAG DRN 8 ADPR NS SKTRN CSTL (BAG) ×1
BAG HAMPER (MISCELLANEOUS) ×2 IMPLANT
CATH INTERMIT  6FR 70CM (CATHETERS) ×2 IMPLANT
CLOTH BEACON ORANGE TIMEOUT ST (SAFETY) ×2 IMPLANT
DECANTER SPIKE VIAL GLASS SM (MISCELLANEOUS) ×2 IMPLANT
EXTRACTOR STONE NITINOL NGAGE (UROLOGICAL SUPPLIES) ×2 IMPLANT
GLOVE SURG POLYISO LF SZ8 (GLOVE) ×2 IMPLANT
GLOVE SURG UNDER POLY LF SZ7 (GLOVE) ×4 IMPLANT
GOWN STRL REUS W/TWL LRG LVL3 (GOWN DISPOSABLE) ×2 IMPLANT
GOWN STRL REUS W/TWL XL LVL3 (GOWN DISPOSABLE) ×2 IMPLANT
GUIDEWIRE STR DUAL SENSOR (WIRE) ×2 IMPLANT
GUIDEWIRE STR ZIPWIRE 035X150 (MISCELLANEOUS) ×2 IMPLANT
IV NS IRRIG 3000ML ARTHROMATIC (IV SOLUTION) ×4 IMPLANT
KIT TURNOVER CYSTO (KITS) ×2 IMPLANT
MANIFOLD NEPTUNE II (INSTRUMENTS) ×2 IMPLANT
PACK CYSTO (CUSTOM PROCEDURE TRAY) ×2 IMPLANT
PAD ARMBOARD 7.5X6 YLW CONV (MISCELLANEOUS) ×2 IMPLANT
SHEATH URETERAL 12FRX35CM (MISCELLANEOUS) ×2 IMPLANT
STENT URET 6FRX26 CONTOUR (STENTS) ×2 IMPLANT
SYR 10ML LL (SYRINGE) ×2 IMPLANT
SYR CONTROL 10ML LL (SYRINGE) ×2 IMPLANT
TOWEL OR 17X26 4PK STRL BLUE (TOWEL DISPOSABLE) ×2 IMPLANT
TRACTIP FLEXIVA PULS ID 200XHI (Laser) IMPLANT
TRACTIP FLEXIVA PULSE ID 200 (Laser)
WATER STERILE IRR 500ML POUR (IV SOLUTION) ×2 IMPLANT

## 2021-07-15 NOTE — Anesthesia Postprocedure Evaluation (Signed)
Anesthesia Post Note  Patient: Thomas Alexander  Procedure(s) Performed: CYSTOSCOPY WITH RETROGRADE PYELOGRAM, URETEROSCOPY AND STENT PLACEMENT (Right) HOLMIUM LASER APPLICATION (Right)  Patient location during evaluation: PACU Anesthesia Type: General Level of consciousness: awake and alert and oriented Pain management: pain level controlled Vital Signs Assessment: post-procedure vital signs reviewed and stable Respiratory status: spontaneous breathing, nonlabored ventilation and respiratory function stable Cardiovascular status: blood pressure returned to baseline and stable Postop Assessment: no apparent nausea or vomiting Anesthetic complications: no   No notable events documented.   Last Vitals:  Vitals:   07/15/21 1000 07/15/21 1006  BP: 120/80 122/82  Pulse: 82 85  Resp: 14 16  Temp:  36.7 C  SpO2: 94% 96%    Last Pain:  Vitals:   07/15/21 1006  TempSrc: Oral  PainSc: 0-No pain                 Avett Reineck C Cisco Kindt

## 2021-07-15 NOTE — Anesthesia Procedure Notes (Signed)
Procedure Name: LMA Insertion Date/Time: 07/15/2021 7:44 AM Performed by: Lucinda Dell, CRNA Pre-anesthesia Checklist: Patient identified, Emergency Drugs available, Patient being monitored and Suction available Patient Re-evaluated:Patient Re-evaluated prior to induction Oxygen Delivery Method: Circle system utilized Preoxygenation: Pre-oxygenation with 100% oxygen Induction Type: IV induction Ventilation: Mask ventilation without difficulty LMA: LMA inserted LMA Size: 5.0 Tube type: Oral Number of attempts: 1 Placement Confirmation: positive ETCO2 and breath sounds checked- equal and bilateral Tube secured with: Tape Dental Injury: Teeth and Oropharynx as per pre-operative assessment

## 2021-07-15 NOTE — Interval H&P Note (Signed)
History and Physical Interval Note:  07/15/2021 7:26 AM  Thomas Alexander  has presented today for surgery, with the diagnosis of right ureteral calculi.  The various methods of treatment have been discussed with the patient and family. After consideration of risks, benefits and other options for treatment, the patient has consented to  Procedure(s): CYSTOSCOPY WITH RETROGRADE PYELOGRAM, URETEROSCOPY AND STENT PLACEMENT (Right) HOLMIUM LASER APPLICATION (Right) as a surgical intervention.  The patient's history has been reviewed, patient examined, no change in status, stable for surgery.  I have reviewed the patient's chart and labs.  Questions were answered to the patient's satisfaction.     Wilkie Aye

## 2021-07-15 NOTE — Op Note (Signed)
.  Preoperative diagnosis: Left ureteral and renal calculi  Postoperative diagnosis: Same  Procedure: 1 cystoscopy 2.  Right retrograde pyelography 3.  Intraoperative fluoroscopy, under one hour, with interpretation 4.  Right ureteroscopic stone manipulation with laser lithotripsy 5.  right 6 x 26 JJ stent placement  Attending: Cleda Mccreedy  Anesthesia: General  Estimated blood loss: None  Drains: Left 6 x 26 JJ ureteral stent without tether  Specimens: stone for analysis  Antibiotics: ancef  Findings: Right distal ureteral calculus. Numerous renal calculi in all poles.  moderate hydronephrosis. No masses/lesions in the bladder. Ureteral orifices in normal anatomic location.  Indications: Patient is a 50 year old male with a history of right renal calculi and who has persistent right flank pain.  After discussing treatment options, he decided proceed with right ureteroscopic stone manipulation.  Procedure in detail: The patient was brought to the operating room and a brief timeout was done to ensure correct patient, correct procedure, correct site.  General anesthesia was administered patient was placed in dorsal lithotomy position.  His genitalia was then prepped and draped in usual sterile fashion.  A rigid 22 French cystoscope was passed in the urethra and the bladder.  Bladder was inspected free masses or lesions.  the ureteral orifices were in the normal orthotopic locations.  a 6 french ureteral catheter was then instilled into the right ureteral orifice.  a gentle retrograde was obtained and findings noted above.  we then placed a zip wire through the ureteral catheter and advanced up to the renal pelvis.  we then removed the cystoscope and cannulated the right ureteral orifice with a semirigid ureteroscope.  We encountered a stone in the distal ureter which was removed with an NGage basket. Once we reached the UPJ a sensor wire was advanced in to the renal pelvis. We then removed  the ureteroscope and advanced am 12/14 x 35cm access sheath up to the renal pelvis. We then used the flexible ureteroscope to perform nephroscopy. We encountered numerous calculi in all poles of the kidney. Using a 242nm laser fiber the stones were fragments    the pieces were then removed with a Ngage basket.    once all stone fragments were removed we then removed the access sheath under direct vision and noted no injury to the ureter. We then placed a 6 x 26 double-j ureteral stent over the original zip wire.  We then removed the wire and good coil was noted in the the renal pelvis under fluoroscopy and the bladder under direct vision. the bladder was then drained and this concluded the procedure which was well tolerated by patient.  Complications: None  Condition: Stable, extubated, transferred to PACU  Plan: Patient is to be discharged home as to follow-up in 2 weeks for left ureteroscopic stone extraction

## 2021-07-15 NOTE — Transfer of Care (Signed)
Immediate Anesthesia Transfer of Care Note  Patient: Thomas Alexander  Procedure(s) Performed: CYSTOSCOPY WITH RETROGRADE PYELOGRAM, URETEROSCOPY AND STENT PLACEMENT (Right) HOLMIUM LASER APPLICATION (Right)  Patient Location: PACU  Anesthesia Type:General  Level of Consciousness: awake, alert , oriented and patient cooperative  Airway & Oxygen Therapy: Patient Spontanous Breathing and Patient connected to nasal cannula oxygen  Post-op Assessment: Report given to RN, Post -op Vital signs reviewed and stable and Patient moving all extremities  Post vital signs: Reviewed and stable  Last Vitals:  Vitals Value Taken Time  BP 117/69 07/15/21 0947  Temp 37.2 C 07/15/21 0947  Pulse 81 07/15/21 0949  Resp 16 07/15/21 0949  SpO2 96 % 07/15/21 0949  Vitals shown include unvalidated device data.  Last Pain:  Vitals:   07/15/21 0646  TempSrc: Oral  PainSc: 2       Patients Stated Pain Goal: 8 (07/15/21 0646)  Complications: No notable events documented.

## 2021-07-15 NOTE — Anesthesia Preprocedure Evaluation (Signed)
Anesthesia Evaluation  Patient identified by MRN, date of birth, ID band Patient awake    Reviewed: Allergy & Precautions, NPO status , Patient's Chart, lab work & pertinent test results  History of Anesthesia Complications Negative for: history of anesthetic complications  Airway Mallampati: III  TM Distance: >3 FB Neck ROM: Full    Dental  (+) Dental Advisory Given, Teeth Intact   Pulmonary sleep apnea and Continuous Positive Airway Pressure Ventilation , Current Smoker and Patient abstained from smoking.,    Pulmonary exam normal breath sounds clear to auscultation       Cardiovascular Exercise Tolerance: Good negative cardio ROS Normal cardiovascular exam Rhythm:Regular Rate:Normal     Neuro/Psych PSYCHIATRIC DISORDERS Anxiety Depression    GI/Hepatic negative GI ROS, Neg liver ROS,   Endo/Other  diabetes, Well Controlled, Type 2, Oral Hypoglycemic Agents  Renal/GU      Musculoskeletal negative musculoskeletal ROS (+)   Abdominal   Peds  Hematology negative hematology ROS (+)   Anesthesia Other Findings   Reproductive/Obstetrics negative OB ROS                            Anesthesia Physical Anesthesia Plan  ASA: 2  Anesthesia Plan: General   Post-op Pain Management:    Induction: Intravenous  PONV Risk Score and Plan: Ondansetron  Airway Management Planned: LMA and Oral ETT  Additional Equipment:   Intra-op Plan:   Post-operative Plan: Extubation in OR  Informed Consent: I have reviewed the patients History and Physical, chart, labs and discussed the procedure including the risks, benefits and alternatives for the proposed anesthesia with the patient or authorized representative who has indicated his/her understanding and acceptance.     Dental advisory given  Plan Discussed with: CRNA and Surgeon  Anesthesia Plan Comments:        Anesthesia Quick  Evaluation

## 2021-07-16 ENCOUNTER — Encounter (HOSPITAL_COMMUNITY): Payer: Self-pay | Admitting: Urology

## 2021-07-21 ENCOUNTER — Ambulatory Visit: Payer: Medicare HMO | Admitting: Urology

## 2021-07-21 ENCOUNTER — Other Ambulatory Visit: Payer: Self-pay

## 2021-07-21 ENCOUNTER — Encounter: Payer: Self-pay | Admitting: Urology

## 2021-07-21 VITALS — BP 136/80 | HR 69 | Ht 73.0 in | Wt 225.2 lb

## 2021-07-21 DIAGNOSIS — N200 Calculus of kidney: Secondary | ICD-10-CM

## 2021-07-21 LAB — CALCULI, WITH PHOTOGRAPH (CLINICAL LAB)
Calcium Oxalate Monohydrate: 100 %
Weight Calculi: 409 mg

## 2021-07-21 LAB — URINALYSIS, ROUTINE W REFLEX MICROSCOPIC
Bilirubin, UA: NEGATIVE
Ketones, UA: NEGATIVE
Leukocytes,UA: NEGATIVE
Nitrite, UA: NEGATIVE
Specific Gravity, UA: 1.015 (ref 1.005–1.030)
Urobilinogen, Ur: 1 mg/dL (ref 0.2–1.0)
pH, UA: 6 (ref 5.0–7.5)

## 2021-07-21 LAB — MICROSCOPIC EXAMINATION
Bacteria, UA: NONE SEEN
Epithelial Cells (non renal): NONE SEEN /hpf (ref 0–10)
RBC, Urine: >30 /hpf — AB (ref 0–2)
Renal Epithel, UA: NONE SEEN /hpf
WBC, UA: NONE SEEN /hpf (ref 0–5)

## 2021-07-21 NOTE — Progress Notes (Signed)
07/21/2021 3:24 PM   Thomas Alexander Feb 04, 1971 [id number]  Referring provider: Majel Homer, MD [id number] [place],  [place] [address]  Followup nephrolithiasis  HPI: [name] is a 50yo here for followup for nephrolithiasis. He underwent right ureteroscopic stone extraction last week. He had over 20 calculi in his kidney. He has mild right flank painw ith the stent in place. He has intermittent gross hematuria. He has mild LUTS. No other complaints today   PMH: Past Medical History:  Diagnosis Date   Anxiety    Crohn disease (HCC) 2008   Depression    Diabetes mellitus without complication (HCC)    Sleep apnea     Surgical History: Past Surgical History:  Procedure Laterality Date   COLECTOMY     x 7 for crohns   COLONOSCOPY     CYSTOSCOPY  2020   CYSTOSCOPY WITH RETROGRADE PYELOGRAM, URETEROSCOPY AND STENT PLACEMENT Right 07/15/2021   Procedure: CYSTOSCOPY WITH RETROGRADE PYELOGRAM, URETEROSCOPY AND STENT PLACEMENT;  Surgeon: Malen Gauze, MD;  Location: [facility];  Service: Urology;  Laterality: Right;   HAND SURGERY Left    removal of cyst   HOLMIUM LASER APPLICATION Right 07/15/2021   Procedure: HOLMIUM LASER APPLICATION;  Surgeon: Malen Gauze, MD;  Location: [facility];  Service: Urology;  Laterality: Right;   KNEE SURGERY Left     Home Medications:  Allergies as of 07/21/2021       Reactions   Nsaids Other (See Comments)   Crohns Disease; takes Humira   Fish Oil Other (See Comments)   Other Reaction: BURNING IN THROAT, STOMACH - pt unaware of this reaction        Medication List        Accurate as of July 21, 2021  3:24 PM. If you have any questions, ask your nurse or doctor.          citalopram 20 MG tablet Commonly known as: CELEXA Take 20 mg by mouth daily.   cyanocobalamin 1000 MCG/ML injection Commonly known as: (VITAMIN B-12) Inject 1,000 mcg into the muscle every 30 (thirty) days.   dicyclomine 10 MG  capsule Commonly known as: BENTYL Take 10 mg by mouth 3 (three) times daily as needed for spasms.   glimepiride 4 MG tablet Commonly known as: AMARYL Take 4 mg by mouth 2 (two) times daily with a meal.   Humira Pen 40 MG/0.4ML Pnkt Generic drug: Adalimumab Inject 40 mg into the skin once a week.   Jardiance 25 MG Tabs tablet Generic drug: empagliflozin Take 25 mg by mouth every morning.   lovastatin 10 MG tablet Commonly known as: MEVACOR Take 10 mg by mouth at bedtime.   magnesium oxide 400 MG tablet Commonly known as: MAG-OX Take 400 mg by mouth daily.   ondansetron 4 MG disintegrating tablet Commonly known as: ZOFRAN-ODT Take 1 tablet (4 mg total) by mouth every 6 (six) hours as needed.   oxyCODONE-acetaminophen 5-325 MG tablet Commonly known as: Percocet Take 1 tablet by mouth every 4 (four) hours as needed for severe pain.   potassium citrate 10 MEQ (1080 MG) SR tablet Commonly known as: UROCIT-K Take 20 mEq by mouth in the morning and at bedtime.   tamsulosin 0.4 MG Caps capsule Commonly known as: FLOMAX Take 1 capsule (0.4 mg total) by mouth daily after supper.   UltiCare Tuberculin Safety Syr 25G X 5/8" 1 ML Misc Generic drug: TUBERCULIN SYR 1CC/25GX5/8"   Vitamin D (Ergocalciferol) 1.25 MG (50000 UNIT)  Caps capsule Commonly known as: DRISDOL Take 50,000 Units by mouth once a week.        Allergies:  Allergies  Allergen Reactions   Nsaids Other (See Comments)    Crohns Disease; takes Humira   Fish Oil Other (See Comments)    Other Reaction: BURNING IN THROAT, STOMACH - pt unaware of this reaction    Family History: No family history on file.  Social History:  reports that he has been smoking cigarettes. He has a 6.25 pack-year smoking history. He does not have any smokeless tobacco history on file. He reports that he does not currently use drugs. No history on file for alcohol use.  ROS: All other review of systems were reviewed and are  negative except what is noted above in HPI  Physical Exam: BP 136/80   Pulse 69   Ht 6\' 1"  (1.854 m)   Wt 225 lb 4 oz (102.2 kg)   BMI 29.72 kg/m   Constitutional:  Alert and oriented, No acute distress. HEENT: [place] AT, moist mucus membranes.  Trachea midline, no masses. Cardiovascular: No clubbing, cyanosis, or edema. Respiratory: Normal respiratory effort, no increased work of breathing. GI: Abdomen is soft, nontender, nondistended, no abdominal masses GU: No CVA tenderness.  Lymph: No cervical or inguinal lymphadenopathy. Skin: No rashes, bruises or suspicious lesions. Neurologic: Grossly intact, no focal deficits, moving all 4 extremities. Psychiatric: Normal mood and affect.  Laboratory Data: Lab Results  Component Value Date   WBC 5.1 07/13/2021   HGB 14.6 07/13/2021   HCT 42.2 07/13/2021   MCV 99 (H) 07/13/2021   PLT 81 (LL) 07/13/2021    Lab Results  Component Value Date   CREATININE 0.88 07/13/2021    No results found for: PSA  No results found for: TESTOSTERONE  No results found for: HGBA1C  Urinalysis    Component Value Date/Time   APPEARANCEUR Clear 07/13/2021 1316   GLUCOSEU 2+ (A) 07/13/2021 1316   BILIRUBINUR Negative 07/13/2021 1316   PROTEINUR Negative 07/13/2021 1316   NITRITE Negative 07/13/2021 1316   LEUKOCYTESUR Negative 07/13/2021 1316    Lab Results  Component Value Date   LABMICR See below: 07/13/2021   WBCUA None seen 07/13/2021   LABEPIT None seen 07/13/2021   BACTERIA None seen 07/13/2021    Pertinent Imaging:  Results for orders placed in visit on 07/13/21  Abdomen 1 view (KUB)  Narrative CLINICAL DATA:  Nephrolithiasis  EXAM: ABDOMEN - 1 VIEW  COMPARISON:  None.  FINDINGS: Numerous calculi overlie the kidneys bilaterally with at least 8 left-sided calculi measuring up to 12 mm and 12 right-sided calculi measuring up to 12 mm. There are multiple irregular calcifications within the right hemipelvis measuring up to  15 mm which are indeterminate, possibly representing ureteral calculi, vascular calcifications, or potential appendicoliths. Normal abdominal gas pattern. No acute bone abnormality.  IMPRESSION: Moderate bilateral nephrolithiasis. Possible right distal urolithiasis. This could be confirmed with CT imaging.   Electronically Signed By: 07/15/21 M.D. On: 07/14/2021 12:53  No results found for this or any previous visit.  No results found for this or any previous visit.  No results found for this or any previous visit.  No results found for this or any previous visit.  No results found for this or any previous visit.  No results found for this or any previous visit.  No results found for this or any previous visit.   Assessment & Plan:    1. Kidney stones -We  discussed the management of kidney stones. These options include observation, ureteroscopy, shockwave lithotripsy (ESWL) and percutaneous nephrolithotomy (PCNL). We discussed which options are relevant to the patient's stone(s). We discussed the natural history of kidney stones as well as the complications of untreated stones and the impact on quality of life without treatment as well as with each of the above listed treatments. We also discussed the efficacy of each treatment in its ability to clear the stone burden. With any of these management options I discussed the signs and symptoms of infection and the need for emergent treatment should these be experienced. For each option we discussed the ability of each procedure to clear the patient of their stone burden.   For observation I described the risks which include but are not limited to silent renal damage, life-threatening infection, need for emergent surgery, failure to pass stone and pain.   For ureteroscopy I described the risks which include bleeding, infection, damage to contiguous structures, positioning injury, ureteral stricture, ureteral avulsion, ureteral  injury, need for prolonged ureteral stent, inability to perform ureteroscopy, need for an interval procedure, inability to clear stone burden, stent discomfort/pain, heart attack, stroke, pulmonary embolus and the inherent risks with general anesthesia.   For shockwave lithotripsy I described the risks which include arrhythmia, kidney contusion, kidney hemorrhage, need for transfusion, pain, inability to adequately break up stone, inability to pass stone fragments, Steinstrasse, infection associated with obstructing stones, need for alternate surgical procedure, need for repeat shockwave lithotripsy, MI, CVA, PE and the inherent risks with anesthesia/conscious sedation.   For PCNL I described the risks including positioning injury, pneumothorax, hydrothorax, need for chest tube, inability to clear stone burden, renal laceration, arterial venous fistula or malformation, need for embolization of kidney, loss of kidney or renal function, need for repeat procedure, need for prolonged nephrostomy tube, ureteral avulsion, MI, CVA, PE and the inherent risks of general anesthesia.   - The patient would like to proceed with left ureteroscopic stone extraction - Urinalysis, Routine w reflex microscopic   No follow-ups on file.  Wilkie Aye, MD  [facility] [place]

## 2021-07-21 NOTE — Progress Notes (Signed)
Urological Symptom Review  Patient is experiencing the following symptoms: Frequent urination Get up at night to urinate Leakage of urine Blood in urine Erection problems (male only) Kidney Stones   Review of Systems  Gastrointestinal (upper)  : Nausea Negative for upper GI symptoms  Gastrointestinal (lower) : Diarrhea Constipation  Constitutional : Negative for symptoms  Skin: Negative for skin symptoms  Eyes: Blurred vision  Ear/Nose/Throat : Negative for Ear/Nose/Throat symptoms  Hematologic/Lymphatic: Negative for Hematologic/Lymphatic symptoms  Cardiovascular : Negative for cardiovascular symptoms  Respiratory : Negative for respiratory symptoms  Endocrine: Excessive thirst  Musculoskeletal: Joint pain  Neurological: Negative for neurological symptoms  Psychologic: Negative for psychiatric symptoms

## 2021-07-22 ENCOUNTER — Telehealth: Payer: Self-pay

## 2021-07-22 NOTE — Telephone Encounter (Signed)
Patient needing to discuss his upcoming surgery.  Needing to know day and time.  Please advise.  Thanks, Rosey Bath

## 2021-07-22 NOTE — Telephone Encounter (Signed)
Reviewed with patient date of surgery

## 2021-07-23 NOTE — Patient Instructions (Addendum)
Thomas Alexander  07/23/2021     @PREFPERIOPPHARMACY @   Your procedure is scheduled on  07/29/2021.   Report to [facility] at  0930 A.M.   Call this number if you have problems the morning of surgery:  [phone]   Remember:  Do not eat or drink after midnight.      DO NOT take any medications for diabetes the morning of your procedure.      Take these medicines the morning of surgery with A SIP OF WATER        celexa, zofran (if needed), oxycodone (if needed), flomax.     Do not wear jewelry, make-up or nail polish.  Do not wear lotions, powders, or perfumes, or deodorant.  Do not shave 48 hours prior to surgery.  Men may shave face and neck.  Do not bring valuables to the hospital.  [facility] is not responsible for any belongings or valuables.  Contacts, dentures or bridgework may not be worn into surgery.  Leave your suitcase in the car.  After surgery it may be brought to your room.  For patients admitted to the hospital, discharge time will be determined by your treatment team.  Patients discharged the day of surgery will not be allowed to drive home and must have someone with them for 24 hours.    Special instructions:   DO NOT smoke tobacco or vape for 24 hours before your procedure.  Please read over the following fact sheets that you were given. Anesthesia Post-op Instructions and Care and Recovery After Surgery        Ureteral Stent Implantation, Care After This sheet gives you information about how to care for yourself after your procedure. Your health care provider may also give you more specific instructions. If you have problems or questions, contact your health care provider. What can I expect after the procedure? After the procedure, it is common to have: Nausea. Mild pain when you urinate. You may feel this pain in your lower back or lower abdomen. The pain should stop within a few minutes after you urinate. This may last for  up to 1 week. A small amount of blood in your urine for several days. Follow these instructions at home: Medicines Take over-the-counter and prescription medicines only as told by your health care provider. If you were prescribed an antibiotic medicine, take it as told by your health care provider. Do not stop taking the antibiotic even if you start to feel better. Do not drive for 24 hours if you were given a sedative during your procedure. Ask your health care provider if the medicine prescribed to you requires you to avoid driving or using heavy machinery. Activity Rest as told by your health care provider. Avoid sitting for a long time without moving. Get up to take short walks every 1-2 hours. This is important to improve blood flow and breathing. Ask for help if you feel weak or unsteady. Return to your normal activities as told by your health care provider. Ask your health care provider what activities are safe for you. General instructions  Watch for any blood in your urine. Call your health care provider if the amount of blood in your urine increases. If you have a catheter: Follow instructions from your health care provider about taking care of your catheter and collection bag. Do not take baths, swim, or use a hot tub until your health care provider approves. Ask  your health care provider if you may take showers. You may only be allowed to take sponge baths. Drink enough fluid to keep your urine pale yellow. Do not use any products that contain nicotine or tobacco, such as cigarettes, e-cigarettes, and chewing tobacco. These can delay healing after surgery. If you need help quitting, ask your health care provider. Keep all follow-up visits as told by your health care provider. This is important. Contact a health care provider if: You have pain that gets worse or does not get better with medicine, especially pain when you urinate. You have difficulty urinating. You feel nauseous or  you vomit repeatedly during a period of more than 2 days after the procedure. Get help right away if: Your urine is dark red or has blood clots in it. You are leaking urine (have incontinence). The end of the stent comes out of your urethra. You cannot urinate. You have sudden, sharp, or severe pain in your abdomen or lower back. You have a fever. You have swelling or pain in your legs. You have difficulty breathing. Summary After the procedure, it is common to have mild pain when you urinate that goes away within a few minutes after you urinate. This may last for up to 1 week. Watch for any blood in your urine. Call your health care provider if the amount of blood in your urine increases. Take over-the-counter and prescription medicines only as told by your health care provider. Drink enough fluid to keep your urine pale yellow. This information is not intended to replace advice given to you by your health care provider. Make sure you discuss any questions you have with your health care provider. Document Revised: 06/19/2018 Document Reviewed: 06/20/2018 Elsevier Patient Education  2022 [employer] General Anesthesia, Adult, Care After This sheet gives you information about how to care for yourself after your procedure. Your health care provider may also give you more specific instructions. If you have problems or questions, contact your health care provider. What can I expect after the procedure? After the procedure, the following side effects are common: Pain or discomfort at the IV site. Nausea. Vomiting. Sore throat. Trouble concentrating. Feeling cold or chills. Feeling weak or tired. Sleepiness and fatigue. Soreness and body aches. These side effects can affect parts of the body that were not involved in surgery. Follow these instructions at home: For the time period you were told by your health care provider:  Rest. Do not participate in activities where you could fall  or become injured. Do not drive or use machinery. Do not drink alcohol. Do not take sleeping pills or medicines that cause drowsiness. Do not make important decisions or sign legal documents. Do not take care of children on your own. Eating and drinking Follow any instructions from your health care provider about eating or drinking restrictions. When you feel hungry, start by eating small amounts of foods that are soft and easy to digest (bland), such as toast. Gradually return to your regular diet. Drink enough fluid to keep your urine pale yellow. If you vomit, rehydrate by drinking water, juice, or clear broth. General instructions If you have sleep apnea, surgery and certain medicines can increase your risk for breathing problems. Follow instructions from your health care provider about wearing your sleep device: Anytime you are sleeping, including during daytime naps. While taking prescription pain medicines, sleeping medicines, or medicines that make you drowsy. Have a responsible adult stay with you for the time you are told.  It is important to have someone help care for you until you are awake and alert. Return to your normal activities as told by your health care provider. Ask your health care provider what activities are safe for you. Take over-the-counter and prescription medicines only as told by your health care provider. If you smoke, do not smoke without supervision. Keep all follow-up visits as told by your health care provider. This is important. Contact a health care provider if: You have nausea or vomiting that does not get better with medicine. You cannot eat or drink without vomiting. You have pain that does not get better with medicine. You are unable to pass urine. You develop a skin rash. You have a fever. You have redness around your IV site that gets worse. Get help right away if: You have difficulty breathing. You have chest pain. You have blood in your urine  or stool, or you vomit blood. Summary After the procedure, it is common to have a sore throat or nausea. It is also common to feel tired. Have a responsible adult stay with you for the time you are told. It is important to have someone help care for you until you are awake and alert. When you feel hungry, start by eating small amounts of foods that are soft and easy to digest (bland), such as toast. Gradually return to your regular diet. Drink enough fluid to keep your urine pale yellow. Return to your normal activities as told by your health care provider. Ask your health care provider what activities are safe for you. This information is not intended to replace advice given to you by your health care provider. Make sure you discuss any questions you have with your health care provider. Document Revised: 05/28/2020 Document Reviewed: 12/26/2019 Elsevier Patient Education  2022 Elsevier Inc. How to Use Chlorhexidine for Bathing Chlorhexidine gluconate (CHG) is a germ-killing (antiseptic) solution that is used to clean the skin. It can get rid of the bacteria that normally live on the skin and can keep them away for about 24 hours. To clean your skin with CHG, you may be given: A CHG solution to use in the shower or as part of a sponge bath. A prepackaged cloth that contains CHG. Cleaning your skin with CHG may help lower the risk for infection: While you are staying in the intensive care unit of the hospital. If you have a vascular access, such as a central line, to provide short-term or long-term access to your veins. If you have a catheter to drain urine from your bladder. If you are on a ventilator. A ventilator is a machine that helps you breathe by moving air in and out of your lungs. After surgery. What are the risks? Risks of using CHG include: A skin reaction. Hearing loss, if CHG gets in your ears and you have a perforated eardrum. Eye injury, if CHG gets in your eyes and is not  rinsed out. The CHG product catching fire. Make sure that you avoid smoking and flames after applying CHG to your skin. Do not use CHG: If you have a chlorhexidine allergy or have previously reacted to chlorhexidine. On babies younger than 57 months of age. How to use CHG solution Use CHG only as told by your health care provider, and follow the instructions on the label. Use the full amount of CHG as directed. Usually, this is one bottle. During a shower Follow these steps when using CHG solution during a shower (unless your  health care provider gives you different instructions): Start the shower. Use your normal soap and shampoo to wash your face and hair. Turn off the shower or move out of the shower stream. Pour the CHG onto a clean washcloth. Do not use any type of brush or rough-edged sponge. Starting at your neck, lather your body down to your toes. Make sure you follow these instructions: If you will be having surgery, pay special attention to the part of your body where you will be having surgery. Scrub this area for at least 1 minute. Do not use CHG on your head or face. If the solution gets into your ears or eyes, rinse them well with water. Avoid your genital area. Avoid any areas of skin that have broken skin, cuts, or scrapes. Scrub your back and under your arms. Make sure to wash skin folds. Let the lather sit on your skin for 1-2 minutes or as long as told by your health care provider. Thoroughly rinse your entire body in the shower. Make sure that all body creases and crevices are rinsed well. Dry off with a clean towel. Do not put any substances on your body afterward--such as powder, lotion, or perfume--unless you are told to do so by your health care provider. Only use lotions that are recommended by the manufacturer. Put on clean clothes or pajamas. If it is the night before your surgery, sleep in clean sheets.  During a sponge bath Follow these steps when using CHG  solution during a sponge bath (unless your health care provider gives you different instructions): Use your normal soap and shampoo to wash your face and hair. Pour the CHG onto a clean washcloth. Starting at your neck, lather your body down to your toes. Make sure you follow these instructions: If you will be having surgery, pay special attention to the part of your body where you will be having surgery. Scrub this area for at least 1 minute. Do not use CHG on your head or face. If the solution gets into your ears or eyes, rinse them well with water. Avoid your genital area. Avoid any areas of skin that have broken skin, cuts, or scrapes. Scrub your back and under your arms. Make sure to wash skin folds. Let the lather sit on your skin for 1-2 minutes or as long as told by your health care provider. Using a different clean, wet washcloth, thoroughly rinse your entire body. Make sure that all body creases and crevices are rinsed well. Dry off with a clean towel. Do not put any substances on your body afterward--such as powder, lotion, or perfume--unless you are told to do so by your health care provider. Only use lotions that are recommended by the manufacturer. Put on clean clothes or pajamas. If it is the night before your surgery, sleep in clean sheets. How to use CHG prepackaged cloths Only use CHG cloths as told by your health care provider, and follow the instructions on the label. Use the CHG cloth on clean, dry skin. Do not use the CHG cloth on your head or face unless your health care provider tells you to. When washing with the CHG cloth: Avoid your genital area. Avoid any areas of skin that have broken skin, cuts, or scrapes. Before surgery Follow these steps when using a CHG cloth to clean before surgery (unless your health care provider gives you different instructions): Using the CHG cloth, vigorously scrub the part of your body where you will be having  surgery. Scrub using a  back-and-forth motion for 3 minutes. The area on your body should be completely wet with CHG when you are done scrubbing. Do not rinse. Discard the cloth and let the area air-dry. Do not put any substances on the area afterward, such as powder, lotion, or perfume. Put on clean clothes or pajamas. If it is the night before your surgery, sleep in clean sheets.  For general bathing Follow these steps when using CHG cloths for general bathing (unless your health care provider gives you different instructions). Use a separate CHG cloth for each area of your body. Make sure you wash between any folds of skin and between your fingers and toes. Wash your body in the following order, switching to a new cloth after each step: The front of your neck, shoulders, and chest. Both of your arms, under your arms, and your hands. Your stomach and groin area, avoiding the genitals. Your right leg and foot. Your left leg and foot. The back of your neck, your back, and your buttocks. Do not rinse. Discard the cloth and let the area air-dry. Do not put any substances on your body afterward--such as powder, lotion, or perfume--unless you are told to do so by your health care provider. Only use lotions that are recommended by the manufacturer. Put on clean clothes or pajamas. Contact a health care provider if: Your skin gets irritated after scrubbing. You have questions about using your solution or cloth. You swallow any chlorhexidine. Call your local poison control center (1-(416)810-3266 in the [place] Get help right away if: Your eyes itch badly, or they become very red or swollen. Your skin itches badly and is red or swollen. Your hearing changes. You have trouble seeing. You have swelling or tingling in your mouth or throat. You have trouble breathing. These symptoms may represent a serious problem that is an emergency. Do not wait to see if the symptoms will go away. Get medical help right away. Call your  local emergency services (911 in the [place] Do not drive yourself to the hospital. Summary Chlorhexidine gluconate (CHG) is a germ-killing (antiseptic) solution that is used to clean the skin. Cleaning your skin with CHG may help to lower your risk for infection. You may be given CHG to use for bathing. It may be in a bottle or in a prepackaged cloth to use on your skin. Carefully follow your health care provider's instructions and the instructions on the product label. Do not use CHG if you have a chlorhexidine allergy. Contact your health care provider if your skin gets irritated after scrubbing. This information is not intended to replace advice given to you by your health care provider. Make sure you discuss any questions you have with your health care provider. Document Revised: 11/23/2020 Document Reviewed: 11/23/2020 Elsevier Patient Education  2022 [employer].

## 2021-07-26 ENCOUNTER — Encounter (HOSPITAL_COMMUNITY)
Admission: RE | Admit: 2021-07-26 | Discharge: 2021-07-26 | Disposition: A | Discharge status: Home / Self Care | Payer: Medicare HMO | Source: Ambulatory Visit | Attending: Urology | Admitting: Urology

## 2021-07-26 ENCOUNTER — Other Ambulatory Visit: Payer: Self-pay

## 2021-07-29 ENCOUNTER — Ambulatory Visit (HOSPITAL_COMMUNITY)
Admission: RE | Admit: 2021-07-29 | Discharge: 2021-07-29 | Disposition: A | Discharge status: Home / Self Care | Payer: Medicare HMO | Attending: Urology | Admitting: Urology

## 2021-07-29 ENCOUNTER — Ambulatory Visit (HOSPITAL_COMMUNITY): Payer: Medicare HMO | Admitting: Certified Registered Nurse Anesthetist

## 2021-07-29 ENCOUNTER — Encounter (HOSPITAL_COMMUNITY): Payer: Self-pay | Admitting: Urology

## 2021-07-29 ENCOUNTER — Encounter (HOSPITAL_COMMUNITY)
Admission: RE | Disposition: A | Discharge status: Home / Self Care | Payer: Self-pay | Source: Home / Self Care | Attending: Urology

## 2021-07-29 ENCOUNTER — Encounter: Payer: Self-pay | Admitting: Urology

## 2021-07-29 ENCOUNTER — Other Ambulatory Visit: Payer: Self-pay

## 2021-07-29 ENCOUNTER — Ambulatory Visit (HOSPITAL_COMMUNITY): Payer: Medicare HMO

## 2021-07-29 DIAGNOSIS — N200 Calculus of kidney: Secondary | ICD-10-CM | POA: Insufficient documentation

## 2021-07-29 DIAGNOSIS — Z79899 Other long term (current) drug therapy: Secondary | ICD-10-CM | POA: Diagnosis not present

## 2021-07-29 DIAGNOSIS — Z888 Allergy status to other drugs, medicaments and biological substances status: Secondary | ICD-10-CM | POA: Diagnosis not present

## 2021-07-29 DIAGNOSIS — Z87442 Personal history of urinary calculi: Secondary | ICD-10-CM | POA: Insufficient documentation

## 2021-07-29 DIAGNOSIS — Z886 Allergy status to analgesic agent status: Secondary | ICD-10-CM | POA: Insufficient documentation

## 2021-07-29 DIAGNOSIS — N201 Calculus of ureter: Secondary | ICD-10-CM

## 2021-07-29 HISTORY — PX: CYSTOSCOPY W/ URETERAL STENT REMOVAL: SHX1430

## 2021-07-29 HISTORY — PX: STONE EXTRACTION WITH BASKET: SHX5318

## 2021-07-29 HISTORY — PX: CYSTOSCOPY WITH RETROGRADE PYELOGRAM, URETEROSCOPY AND STENT PLACEMENT: SHX5789

## 2021-07-29 HISTORY — PX: HOLMIUM LASER APPLICATION: SHX5852

## 2021-07-29 LAB — GLUCOSE, CAPILLARY: Glucose-Capillary: 120 mg/dL — ABNORMAL HIGH (ref 70–99)

## 2021-07-29 SURGERY — CYSTOURETEROSCOPY, WITH RETROGRADE PYELOGRAM AND STENT INSERTION
Anesthesia: General | Laterality: Left

## 2021-07-29 MED ORDER — PROPOFOL 10 MG/ML IV BOLUS
INTRAVENOUS | Status: DC | PRN
  Administered 2021-07-29: 200 mg via INTRAVENOUS

## 2021-07-29 MED ORDER — SODIUM CHLORIDE 0.9 % IR SOLN
Status: DC | PRN
  Administered 2021-07-29 (×2): 3000 mL

## 2021-07-29 MED ORDER — SODIUM CHLORIDE FLUSH 0.9 % IV SOLN
INTRAVENOUS | Status: AC
  Filled 2021-07-29: qty 10

## 2021-07-29 MED ORDER — WATER FOR IRRIGATION, STERILE IR SOLN
Status: DC | PRN
  Administered 2021-07-29: 500 mL

## 2021-07-29 MED ORDER — OXYCODONE-ACETAMINOPHEN 5-325 MG PO TABS: 1.0000 | ORAL_TABLET | ORAL | 0 refills | Status: DC | PRN

## 2021-07-29 MED ORDER — DIATRIZOATE MEGLUMINE 30 % UR SOLN
URETHRAL | Status: DC | PRN
  Administered 2021-07-29: 10 mL

## 2021-07-29 MED ORDER — LACTATED RINGERS IV SOLN: INTRAVENOUS | Status: DC

## 2021-07-29 MED ORDER — FENTANYL CITRATE (PF) 100 MCG/2ML IJ SOLN
INTRAMUSCULAR | Status: AC
  Filled 2021-07-29: qty 2

## 2021-07-29 MED ORDER — FENTANYL CITRATE (PF) 250 MCG/5ML IJ SOLN
INTRAMUSCULAR | Status: DC | PRN
  Administered 2021-07-29: 50 ug via INTRAVENOUS
  Administered 2021-07-29 (×3): 25 ug via INTRAVENOUS
  Administered 2021-07-29: 50 ug via INTRAVENOUS
  Administered 2021-07-29: 25 ug via INTRAVENOUS

## 2021-07-29 MED ORDER — LIDOCAINE HCL (CARDIAC) PF 100 MG/5ML IV SOSY
PREFILLED_SYRINGE | INTRAVENOUS | Status: DC | PRN
  Administered 2021-07-29: 100 mg via INTRAVENOUS

## 2021-07-29 MED ORDER — ONDANSETRON HCL 4 MG/2ML IJ SOLN
INTRAMUSCULAR | Status: DC | PRN
  Administered 2021-07-29: 4 mg via INTRAVENOUS

## 2021-07-29 MED ORDER — MIDAZOLAM HCL 2 MG/2ML IJ SOLN
INTRAMUSCULAR | Status: AC
  Filled 2021-07-29: qty 2

## 2021-07-29 MED ORDER — CHLORHEXIDINE GLUCONATE 0.12 % MT SOLN: 15.0000 mL | Freq: Once | OROMUCOSAL | Status: DC

## 2021-07-29 MED ORDER — CEFAZOLIN SODIUM-DEXTROSE 2-4 GM/100ML-% IV SOLN
2.0000 g | INTRAVENOUS | Status: AC
  Administered 2021-07-29: 2 g via INTRAVENOUS

## 2021-07-29 MED ORDER — LIDOCAINE HCL (PF) 2 % IJ SOLN
INTRAMUSCULAR | Status: AC
  Filled 2021-07-29: qty 5

## 2021-07-29 MED ORDER — LACTATED RINGERS IV SOLN: INTRAVENOUS | Status: DC | PRN

## 2021-07-29 MED ORDER — DIATRIZOATE MEGLUMINE 30 % UR SOLN
URETHRAL | Status: AC
  Filled 2021-07-29: qty 100

## 2021-07-29 MED ORDER — MIDAZOLAM HCL 2 MG/2ML IJ SOLN
INTRAMUSCULAR | Status: DC | PRN
  Administered 2021-07-29: 2 mg via INTRAVENOUS

## 2021-07-29 MED ORDER — ONDANSETRON HCL 4 MG/2ML IJ SOLN
INTRAMUSCULAR | Status: AC
  Filled 2021-07-29: qty 2

## 2021-07-29 MED ORDER — ORAL CARE MOUTH RINSE: 15.0000 mL | Freq: Once | OROMUCOSAL | Status: DC

## 2021-07-29 MED ORDER — PROPOFOL 10 MG/ML IV BOLUS
INTRAVENOUS | Status: AC
  Filled 2021-07-29: qty 40

## 2021-07-29 SURGICAL SUPPLY — 25 items
BAG DRAIN URO TABLE W/ADPT NS (BAG) ×3 IMPLANT
BAG DRN 8 ADPR NS SKTRN CSTL (BAG) ×2
BAG HAMPER (MISCELLANEOUS) ×3 IMPLANT
CATH INTERMIT  6FR 70CM (CATHETERS) ×3 IMPLANT
CLOTH BEACON ORANGE TIMEOUT ST (SAFETY) ×3 IMPLANT
EXTRACTOR STONE NITINOL NGAGE (UROLOGICAL SUPPLIES) ×9 IMPLANT
GLOVE SURG POLYISO LF SZ8 (GLOVE) ×3 IMPLANT
GLOVE SURG UNDER POLY LF SZ7 (GLOVE) ×6 IMPLANT
GOWN STRL REUS W/TWL LRG LVL3 (GOWN DISPOSABLE) ×3 IMPLANT
GOWN STRL REUS W/TWL XL LVL3 (GOWN DISPOSABLE) ×3 IMPLANT
GUIDEWIRE STR DUAL SENSOR (WIRE) ×3 IMPLANT
GUIDEWIRE STR ZIPWIRE 035X150 (MISCELLANEOUS) ×3 IMPLANT
IV NS IRRIG 3000ML ARTHROMATIC (IV SOLUTION) ×6 IMPLANT
KIT TURNOVER CYSTO (KITS) ×3 IMPLANT
MANIFOLD NEPTUNE II (INSTRUMENTS) ×3 IMPLANT
PACK CYSTO (CUSTOM PROCEDURE TRAY) ×3 IMPLANT
PAD ARMBOARD 7.5X6 YLW CONV (MISCELLANEOUS) ×3 IMPLANT
SHEATH URETERAL 12FRX35CM (MISCELLANEOUS) ×3 IMPLANT
STENT URET 6FRX26 CONTOUR (STENTS) ×3 IMPLANT
SYR 10ML LL (SYRINGE) ×3 IMPLANT
SYR CONTROL 10ML LL (SYRINGE) ×3 IMPLANT
TOWEL OR 17X26 4PK STRL BLUE (TOWEL DISPOSABLE) ×3 IMPLANT
TRACTIP FLEXIVA PULS ID 200XHI (Laser) ×2 IMPLANT
TRACTIP FLEXIVA PULSE ID 200 (Laser) ×3
WATER STERILE IRR 500ML POUR (IV SOLUTION) ×3 IMPLANT

## 2021-07-29 NOTE — Anesthesia Preprocedure Evaluation (Signed)
Anesthesia Evaluation  Patient identified by MRN, date of birth, ID band Patient awake    Reviewed: Allergy & Precautions, H&P , NPO status , Patient's Chart, lab work & pertinent test results, reviewed documented beta blocker date and time   Airway Mallampati: II  TM Distance: >3 FB Neck ROM: full    Dental no notable dental hx.    Pulmonary sleep apnea , Current Smoker and Patient abstained from smoking.,    Pulmonary exam normal breath sounds clear to auscultation       Cardiovascular Exercise Tolerance: Good negative cardio ROS   Rhythm:regular Rate:Normal     Neuro/Psych PSYCHIATRIC DISORDERS Anxiety Depression negative neurological ROS     GI/Hepatic negative GI ROS, Neg liver ROS,   Endo/Other  negative endocrine ROSdiabetes, Well Controlled, Type 2  Renal/GU negative Renal ROS  negative genitourinary   Musculoskeletal   Abdominal   Peds  Hematology negative hematology ROS (+)   Anesthesia Other Findings   Reproductive/Obstetrics negative OB ROS                             Anesthesia Physical Anesthesia Plan  ASA: 2  Anesthesia Plan: General   Post-op Pain Management:    Induction:   PONV Risk Score and Plan: Propofol infusion  Airway Management Planned:   Additional Equipment:   Intra-op Plan:   Post-operative Plan:   Informed Consent: I have reviewed the patients History and Physical, chart, labs and discussed the procedure including the risks, benefits and alternatives for the proposed anesthesia with the patient or authorized representative who has indicated his/her understanding and acceptance.     Dental Advisory Given  Plan Discussed with: CRNA  Anesthesia Plan Comments:         Anesthesia Quick Evaluation

## 2021-07-29 NOTE — Anesthesia Procedure Notes (Signed)
Procedure Name: LMA Insertion Date/Time: 07/29/2021 10:11 AM Performed by: Lorin Glass, CRNA Pre-anesthesia Checklist: Patient identified, Emergency Drugs available, Patient being monitored and Suction available Patient Re-evaluated:Patient Re-evaluated prior to induction Oxygen Delivery Method: Circle system utilized Preoxygenation: Pre-oxygenation with 100% oxygen Induction Type: IV induction LMA: LMA inserted LMA Size: 5.0 Number of attempts: 1 Placement Confirmation: positive ETCO2 and breath sounds checked- equal and bilateral Tube secured with: Tape Dental Injury: Teeth and Oropharynx as per pre-operative assessment

## 2021-07-29 NOTE — Transfer of Care (Signed)
Immediate Anesthesia Transfer of Care Note  Patient: Thomas Alexander  Procedure(s) Performed: CYSTOSCOPY WITH LEFT RETROGRADE PYELOGRAM, LEFT URETEROSCOPY AND LEFT URETERAL STENT PLACEMENT (Left) HOLMIUM LASER APPLICATION (Left) CYSTOSCOPY WITH RIGHT URETERAL STENT REMOVAL STONE EXTRACTION WITH BASKET (Left)  Patient Location: PACU  Anesthesia Type:General  Level of Consciousness: drowsy  Airway & Oxygen Therapy: Patient Spontanous Breathing and Patient connected to face mask oxygen  Post-op Assessment: Report given to RN and Post -op Vital signs reviewed and stable  Post vital signs: Reviewed and stable  Last Vitals:  Vitals Value Taken Time  BP 136/54   Temp    Pulse 88 07/29/21 1205  Resp 19 07/29/21 1205  SpO2 93 % 07/29/21 1205  Vitals shown include unvalidated device data.  Last Pain:  Vitals:   07/29/21 0850  TempSrc: Oral  PainSc: 1       Patients Stated Pain Goal: 8 (07/29/21 0850)  Complications: No notable events documented.

## 2021-07-29 NOTE — Interval H&P Note (Signed)
History and Physical Interval Note:  07/29/2021 9:42 AM  Thomas Alexander  has presented today for surgery, with the diagnosis of left renal calculus.  The various methods of treatment have been discussed with the patient and family. After consideration of risks, benefits and other options for treatment, the patient has consented to  Procedure(s): CYSTOSCOPY WITH RETROGRADE PYELOGRAM, URETEROSCOPY AND STENT PLACEMENT (Left) HOLMIUM LASER APPLICATION (Left) as a surgical intervention.  The patient's history has been reviewed, patient examined, no change in status, stable for surgery.  I have reviewed the patient's chart and labs.  Questions were answered to the patient's satisfaction.     Wilkie Aye

## 2021-07-29 NOTE — Patient Instructions (Signed)
Ureteroscopy Ureteroscopy is a procedure to check for and treat problems inside part of the urinary tract. In this procedure, a thin, flexible tube with a light at the end (ureteroscope) is used to look at the inside of the kidneys and the ureters. The ureters are the tubes that carry urine from the kidneys to the bladder. The ureteroscope isinserted into one or both of the ureters. You may need this procedure if you have frequent urinary tract infections (UTIs), blood in your urine, or a stone in one of your ureters. A ureteroscopy can be done: To find the cause of urine blockage in a ureter and to evaluate other abnormalities inside the ureters or kidneys. To remove stones. To remove or treat growths of tissue (polyps), abnormal tissue, and some types of tumors. To remove a tissue sample and check it for disease under a microscope (biopsy). Tell a health care provider about: Any allergies you have. All medicines you are taking, including vitamins, herbs, eye drops, creams, and over-the-counter medicines. Any problems you or family members have had with anesthetic medicines. Any blood disorders you have. Any surgeries you have had. Any medical conditions you have. Whether you are pregnant or may be pregnant. What are the risks? Generally, this is a safe procedure. However, problems may occur, including: Bleeding. Infection. Allergic reactions to medicines. Scarring that narrows the ureter (stricture). Creating a hole in the ureter (perforation). What happens before the procedure? Staying hydrated Follow instructions from your health care provider about hydration, which may include: Up to 2 hours before the procedure - you may continue to drink clear liquids, such as water, clear fruit juice, black coffee, and plain tea.  Eating and drinking restrictions Follow instructions from your health care provider about eating and drinking, which may include: 8 hours before the procedure - stop  eating heavy meals or foods, such as meat, fried foods, or fatty foods. 6 hours before the procedure - stop eating light meals or foods, such as toast or cereal. 6 hours before the procedure - stop drinking milk or drinks that contain milk. 2 hours before the procedure - stop drinking clear liquids. Medicines Ask your health care provider about: Changing or stopping your regular medicines. This is especially important if you are taking diabetes medicines or blood thinners. Taking medicines such as aspirin and ibuprofen. These medicines can thin your blood. Do not take these medicines unless your health care provider tells you to take them. Taking over-the-counter medicines, vitamins, herbs, and supplements. General instructions Do not use any products that contain nicotine or tobacco for at least 4 weeks before the procedure. These products include cigarettes, e-cigarettes, and chewing tobacco. If you need help quitting, ask your health care provider. You may have a urine sample taken to check for infection. Plan to have someone take you home from the hospital or clinic. If you will be going home right after the procedure, plan to have someone with you for 24 hours. Ask your health care provider what steps will be taken to help prevent infection. These may include: Washing skin with a germ-killing soap. Receiving antibiotic medicine. What happens during the procedure?  An IV will be inserted into one of your veins. You will be given one or more of the following: A medicine to help you relax (sedative). A medicine to make you fall asleep (general anesthetic). A medicine that is injected into your spine to numb the area below and slightly above the injection site (spinal anesthetic). The part   of your body that drains urine from your bladder (urethra) will be cleaned with a germ-killing solution. The ureteroscope will be passed through your urethra into your bladder. A salt-water solution will  be sent through the ureteroscope to fill your bladder. This will help the health care provider see the openings of your ureters more clearly. The ureteroscope will be passed into your ureter. If a growth is found, a biopsy may be done. If a stone is found, it may be removed through the ureteroscope, or the stone may be broken up using a laser, shock waves, or electrical energy. In some cases, if the ureter is too small, a tube may be inserted that keeps the ureter open (ureteral stent). The stent may be left in place for 1 or 2 weeks to keep the ureter open, and then the ureteroscopy procedure will be done. The scope will be removed, and your bladder will be emptied. The procedure may vary among health care providers and hospitals. What can I expect after the procedure? After your procedure, it is common to have: Your blood pressure, heart rate, breathing rate, and blood oxygen level monitored until you leave the hospital or clinic. A burning sensation when you urinate. You may be asked to urinate. Blood in your urine. Mild discomfort in your bladder area or kidney area when urinating. A need to urinate more often or urgently. Follow these instructions at home: Medicines Take over-the-counter and prescription medicines only as told by your health care provider. If you were prescribed an antibiotic medicine, take it as told by your health care provider. Do not stop taking the antibiotic even if you start to feel better. General instructions  If you were given a sedative during the procedure, it can affect you for several hours. Do not drive or operate machinery until your health care provider says that it is safe. To relieve burning, take a warm bath or hold a warm washcloth over your groin. Drink enough fluid to keep your urine pale yellow. Drink two 8-ounce (237 mL) glasses of water every hour for the first 2 hours after you get home. Continue to drink water often at home. You can eat what  you normally do. Keep all follow-up visits as told by your health care provider. This is important. If you had a ureteral stent placed, ask your health care provider when you need to return to have it removed.  Contact a health care provider if you have: Chills or a fever. Burning pain for longer than 24 hours after the procedure. Blood in your urine for longer than 24 hours after the procedure. Get help right away if you have: Large amounts of blood in your urine. Blood clots in your urine. Severe pain. Chest pain or trouble breathing. The feeling of a full bladder and you are unable to urinate. These symptoms may represent a serious problem that is an emergency. Do not wait to see if the symptoms will go away. Get medical help right away. Call your local emergency services (911 in the [place] Summary Ureteroscopy is a procedure to check for and treat problems inside part of the urinary tract. In this procedure, a thin, flexible tube with a light at the end (ureteroscope) is used to look at the inside of the kidneys and the ureters. You may need this procedure if you have frequent urinary tract infections (UTIs), blood in your urine, or a stone in a ureter. This information is not intended to replace advice given to   you by your health care provider. Make sure you discuss any questions you have with your healthcare provider. Document Revised: 06/19/2019 Document Reviewed: 06/19/2019 Elsevier Patient Education  2022 [employer] Inc.  

## 2021-07-29 NOTE — Op Note (Signed)
.  Preoperative diagnosis: Left renal calculi  Postoperative diagnosis: Same  Procedure: 1 cystoscopy 2. Left retrograde pyelography 3.  Intraoperative fluoroscopy, under one hour, with interpretation 4.  Left ureteroscopic stone manipulation with laser lithotripsy 5.  Left 6 x 26 JJ stent placement 6. Right ureteral stent removal  Attending: Wilkie Aye  Anesthesia: General  Estimated blood loss: None  Drains: Left 6 x 26 JJ ureteral stent without tether  Specimens: stone for analysis  Antibiotics: ancef  Findings: over 20 calculi in all poles of the left kidney. No hydronephrosis. No masses/lesions in the bladder. Ureteral orifices in normal anatomic location.  Indications: Patient is a 50 year old male with a history of left renal stone and who has persistent left flank pain.  After discussing treatment options, he decided proceed with left ureteroscopic stone manipulation.  Procedure in detail: The patient was brought to the operating room and a brief timeout was done to ensure correct patient, correct procedure, correct site.  General anesthesia was administered patient was placed in dorsal lithotomy position.  Her genitalia was then prepped and draped in usual sterile fashion.  A rigid 22 French cystoscope was passed in the urethra and the bladder.  Bladder was inspected free masses or lesions.  the ureteral orifices were in the normal orthotopic locations. Using a grasper the right ureteral stent was removed intact. a 6 french ureteral catheter was then instilled into the left ureteral orifice.  a gentle retrograde was obtained and findings noted above.  we then placed a zip wire through the ureteral catheter and advanced up to the renal pelvis.  we then removed the cystoscope and cannulated the left ureteral orifice with a semirigid ureteroscope.  No stone was found in the ureter. Once we reached the UPJ a sensor wire was advanced in to the renal pelvis. We then removed the  ureteroscope and advanced am 12/14 x 35cm access sheath up to the renal pelvis. We then used the flexible ureteroscope to perform nephroscopy. We encountered numerous stone in the kidney. Using a 242nm laser fiber the stones were fragmented.   The fragments were then removed with a Ngage basket.    once all stone fragments were removed we then removed the access sheath under direct vision and noted no injury to the ureter. We then placed a 6 x 26 double-j ureteral stent over the original zip wire.  We then removed the wire and good coil was noted in the the renal pelvis under fluoroscopy and the bladder under direct vision. the bladder was then drained and this concluded the procedure which was well tolerated by patient.  Complications: None  Condition: Stable, extubated, transferred to PACU  Plan: Patient is to be discharged home as to follow-up in one week for stent removal.

## 2021-07-30 ENCOUNTER — Encounter (HOSPITAL_COMMUNITY): Payer: Self-pay | Admitting: Urology

## 2021-07-30 NOTE — Anesthesia Postprocedure Evaluation (Signed)
Anesthesia Post Note  Patient: Thomas Alexander  Procedure(s) Performed: CYSTOSCOPY WITH LEFT RETROGRADE PYELOGRAM, LEFT URETEROSCOPY AND LEFT URETERAL STENT PLACEMENT (Left) HOLMIUM LASER APPLICATION (Left) CYSTOSCOPY WITH RIGHT URETERAL STENT REMOVAL STONE EXTRACTION WITH BASKET (Left)  Patient location during evaluation: Phase II Anesthesia Type: General Level of consciousness: awake Pain management: pain level controlled Vital Signs Assessment: post-procedure vital signs reviewed and stable Respiratory status: spontaneous breathing and respiratory function stable Cardiovascular status: blood pressure returned to baseline and stable Postop Assessment: no headache and no apparent nausea or vomiting Anesthetic complications: no Comments: Late entry   No notable events documented.   Last Vitals:  Vitals:   07/29/21 1232 07/29/21 1255  BP: (!) 147/79 129/80  Pulse: (!) 103 81  Resp: (!) 23 20  Temp:  36.6 C  SpO2: 94% 97%    Last Pain:  Vitals:   07/29/21 1255  TempSrc: Oral  PainSc: 2                  Windell Norfolk

## 2021-08-04 ENCOUNTER — Other Ambulatory Visit: Payer: Self-pay

## 2021-08-04 ENCOUNTER — Ambulatory Visit (INDEPENDENT_AMBULATORY_CARE_PROVIDER_SITE_OTHER): Payer: Medicare HMO | Admitting: Urology

## 2021-08-04 ENCOUNTER — Encounter: Payer: Self-pay | Admitting: Urology

## 2021-08-04 VITALS — BP 126/69 | HR 81 | Temp 99.0°F

## 2021-08-04 DIAGNOSIS — N200 Calculus of kidney: Secondary | ICD-10-CM | POA: Diagnosis not present

## 2021-08-04 LAB — MICROSCOPIC EXAMINATION
RBC, Urine: >30 /hpf — AB (ref 0–2)
Renal Epithel, UA: NONE SEEN /hpf

## 2021-08-04 LAB — URINALYSIS, ROUTINE W REFLEX MICROSCOPIC
Bilirubin, UA: NEGATIVE
Nitrite, UA: NEGATIVE
Specific Gravity, UA: >1.03 — ABNORMAL HIGH (ref 1.005–1.030)
Urobilinogen, Ur: 2 mg/dL — ABNORMAL HIGH (ref 0.2–1.0)
pH, UA: 5.5 (ref 5.0–7.5)

## 2021-08-04 LAB — CALCULI, WITH PHOTOGRAPH (CLINICAL LAB)
Calcium Oxalate Dihydrate: 30 %
Calcium Oxalate Monohydrate: 70 %
Weight Calculi: 320 mg

## 2021-08-04 MED ORDER — CIPROFLOXACIN HCL 500 MG PO TABS
500.0000 mg | ORAL_TABLET | Freq: Once | ORAL | Status: AC
  Administered 2021-08-04: 500 mg via ORAL

## 2021-08-04 NOTE — Patient Instructions (Signed)
Dietary Guidelines to Help Prevent Kidney Stones Kidney stones are deposits of minerals and salts that form inside your kidneys. Your risk of developing kidney stones may be greater depending on your diet, your lifestyle, the medicines you take, and whether you have certain medical conditions. Most people can lower their chances of developing kidney stones by following the instructions below. Your dietitian may give you more specific instructions depending on your overall health and the type of kidney stones you tend to develop. What are tips for following this plan? Reading food labels  Choose foods with "no salt added" or "low-salt" labels. Limit your salt (sodium) intake to less than 1,500 mg a day. Choose foods with calcium for each meal and snack. Try to eat about 300 mg of calcium at each meal. Foods that contain 200-500 mg of calcium a serving include: 8 oz (237 mL) of milk, calcium-fortifiednon-dairy milk, and calcium-fortifiedfruit juice. Calcium-fortified means that calcium has been added to these drinks. 8 oz (237 mL) of kefir, yogurt, and soy yogurt. 4 oz (114 g) of tofu. 1 oz (28 g) of cheese. 1 cup (150 g) of dried figs. 1 cup (91 g) of cooked broccoli. One 3 oz (85 g) can of sardines or mackerel. Most people need 1,000-1,500 mg of calcium a day. Talk to your dietitian about how much calcium is recommended for you. Shopping Buy plenty of fresh fruits and vegetables. Most people do not need to avoid fruits and vegetables, even if these foods contain nutrients that may contribute to kidney stones. When shopping for convenience foods, choose: Whole pieces of fruit. Pre-made salads with dressing on the side. Low-fat fruit and yogurt smoothies. Avoid buying frozen meals or prepared deli foods. These can be high in sodium. Look for foods with live cultures, such as yogurt and kefir. Choose high-fiber grains, such as whole-wheat breads, oat bran, and wheat cereals. Cooking Do not add  salt to food when cooking. Place a salt shaker on the table and allow each person to add his or her own salt to taste. Use vegetable protein, such as beans, textured vegetable protein (TVP), or tofu, instead of meat in pasta, casseroles, and soups. Meal planning Eat less salt, if told by your dietitian. To do this: Avoid eating processed or pre-made food. Avoid eating fast food. Eat less animal protein, including cheese, meat, poultry, or fish, if told by your dietitian. To do this: Limit the number of times you have meat, poultry, fish, or cheese each week. Eat a diet free of meat at least 2 days a week. Eat only one serving each day of meat, poultry, fish, or seafood. When you prepare animal protein, cut pieces into small portion sizes. For most meat and fish, one serving is about the size of the palm of your hand. Eat at least five servings of fresh fruits and vegetables each day. To do this: Keep fruits and vegetables on hand for snacks. Eat one piece of fruit or a handful of berries with breakfast. Have a salad and fruit at lunch. Have two kinds of vegetables at dinner. Limit foods that are high in a substance called oxalate. These include: Spinach (cooked), rhubarb, beets, sweet potatoes, and Swiss chard. Peanuts. Potato chips, french fries, and baked potatoes with skin on. Nuts and nut products. Chocolate. If you regularly take a diuretic medicine, make sure to eat at least 1 or 2 servings of fruits or vegetables that are high in potassium each day. These include: Avocado. Banana. Orange, prune,   carrot, or tomato juice. Baked potato. Cabbage. Beans and split peas. Lifestyle  Drink enough fluid to keep your urine pale yellow. This is the most important thing you can do. Spread your fluid intake throughout the day. If you drink alcohol: Limit how much you use to: 0-1 drink a day for women who are not pregnant. 0-2 drinks a day for men. Be aware of how much alcohol is in your  drink. In the [place] one drink equals one 12 oz bottle of beer (355 mL), one 5 oz glass of wine (148 mL), or one 1 oz glass of hard liquor (44 mL). Lose weight if told by your health care provider. Work with your dietitian to find an eating plan and weight loss strategies that work best for you. General information Talk to your health care provider and dietitian about taking daily supplements. You may be told the following depending on your health and the cause of your kidney stones: Not to take supplements with vitamin C. To take a calcium supplement. To take a daily probiotic supplement. To take other supplements such as magnesium, fish oil, or vitamin B6. Take over-the-counter and prescription medicines only as told by your health care provider. These include supplements. What foods should I limit? Limit your intake of the following foods, or eat them as told by your dietitian. Vegetables Spinach. Rhubarb. Beets. Canned vegetables. Pickles. Olives. Baked potatoes with skin. Grains Wheat bran. Baked goods. Salted crackers. Cereals high in sugar. Meats and other proteins Nuts. Nut butters. Large portions of meat, poultry, or fish. Salted, precooked, or cured meats, such as sausages, meat loaves, and hot dogs. Dairy Cheese. Beverages Regular soft drinks. Regular vegetable juice. Seasonings and condiments Seasoning blends with salt. Salad dressings. Soy sauce. Ketchup. Barbecue sauce. Other foods Canned soups. Canned pasta sauce. Casseroles. Pizza. Lasagna. Frozen meals. Potato chips. French fries. The items listed above may not be a complete list of foods and beverages you should limit. Contact a dietitian for more information. What foods should I avoid? Talk to your dietitian about specific foods you should avoid based on the type of kidney stones you have and your overall health. Fruits Grapefruit. The item listed above may not be a complete list of foods and beverages you should  avoid. Contact a dietitian for more information. Summary Kidney stones are deposits of minerals and salts that form inside your kidneys. You can lower your risk of kidney stones by making changes to your diet. The most important thing you can do is drink enough fluid. Drink enough fluid to keep your urine pale yellow. Talk to your dietitian about how much calcium you should have each day, and eat less salt and animal protein as told by your dietitian. This information is not intended to replace advice given to you by your health care provider. Make sure you discuss any questions you have with your health care provider. Document Revised: 09/05/2019 Document Reviewed: 09/05/2019 Elsevier Patient Education  2022 [employer]  

## 2021-08-04 NOTE — Progress Notes (Signed)
   08/04/21  CC: followup nephrolithiasis   HPI: Thomas Alexander is a 50yo here for left ureteral stent removal Blood pressure 126/69, pulse 81, temperature 99 F (37.2 C). NED. A&Ox3.   No respiratory distress   Abd soft, NT, ND Normal phallus with bilateral descended testicles  Cystoscopy Procedure Note  Patient identification was confirmed, informed consent was obtained, and patient was prepped using Betadine solution.  Lidocaine jelly was administered per urethral meatus.     Pre-Procedure: - Inspection reveals a normal caliber ureteral meatus.  Procedure: The flexible cystoscope was introduced without difficulty - No urethral strictures/lesions are present. - Enlarged prostate  - Normal bladder neck - Bilateral ureteral orifices identified - Bladder mucosa  reveals no ulcers, tumors, or lesions - No bladder stones - No trabeculation  Using a grasper the left ureteral stent was removed intact   Post-Procedure: - Patient tolerated the procedure well  Assessment/ Plan: We will pursue metabolic evaluation for his recurrent nephrolithiasis. RTc 6 weeks with renal US  Wilkie Aye, MD

## 2021-08-04 NOTE — Progress Notes (Signed)
Urological Symptom Review  Patient is experiencing the following symptoms: Frequent urination Get up at night to urinate Leakage of urine Blood in urine Erection problems (male only)   Review of Systems  Gastrointestinal (upper)  : Nausea  Gastrointestinal (lower) : Diarrhea Constipation  Constitutional : Fever  Skin: Itching  Eyes: Blurred vision  Ear/Nose/Throat : Negative for Ear/Nose/Throat symptoms  Hematologic/Lymphatic: Negative for Hematologic/Lymphatic symptoms  Cardiovascular : Negative for cardiovascular symptoms  Respiratory : Negative for respiratory symptoms  Endocrine: Excessive thirst  Musculoskeletal: Back pain Joint pain  Neurological: Negative for neurological symptoms  Psychologic: Negative for psychiatric symptoms

## 2021-08-05 LAB — BASIC METABOLIC PANEL
BUN/Creatinine Ratio: 6 — ABNORMAL LOW (ref 9–20)
BUN: 5 mg/dL — ABNORMAL LOW (ref 6–24)
CO2: 23 mmol/L (ref 20–29)
Calcium: 9 mg/dL (ref 8.7–10.2)
Chloride: 103 mmol/L (ref 96–106)
Creatinine, Ser: 0.83 mg/dL (ref 0.76–1.27)
Glucose: 130 mg/dL — ABNORMAL HIGH (ref 70–99)
Potassium: 4.1 mmol/L (ref 3.5–5.2)
Sodium: 138 mmol/L (ref 134–144)
eGFR: 105 mL/min/{1.73_m2} (ref >59)

## 2021-08-05 LAB — PTH, INTACT AND CALCIUM: PTH: 23 pg/mL (ref 15–65)

## 2021-08-05 LAB — URIC ACID: Uric Acid: 4.3 mg/dL (ref 3.8–8.4)

## 2021-08-06 ENCOUNTER — Other Ambulatory Visit: Payer: Medicare HMO

## 2021-08-06 ENCOUNTER — Encounter: Payer: Self-pay | Admitting: Urology

## 2021-08-06 ENCOUNTER — Other Ambulatory Visit: Payer: Self-pay

## 2021-08-06 DIAGNOSIS — N200 Calculus of kidney: Secondary | ICD-10-CM

## 2021-08-06 LAB — URINALYSIS, ROUTINE W REFLEX MICROSCOPIC
Bilirubin, UA: NEGATIVE
Ketones, UA: NEGATIVE
Leukocytes,UA: NEGATIVE
Nitrite, UA: NEGATIVE
Protein,UA: NEGATIVE
Specific Gravity, UA: 1.02 (ref 1.005–1.030)
Urobilinogen, Ur: 1 mg/dL (ref 0.2–1.0)
pH, UA: 6.5 (ref 5.0–7.5)

## 2021-08-06 LAB — MICROSCOPIC EXAMINATION
Bacteria, UA: NONE SEEN
RBC, Urine: >30 /hpf — AB (ref 0–2)
Renal Epithel, UA: NONE SEEN /hpf
WBC, UA: NONE SEEN /hpf (ref 0–5)

## 2021-08-06 MED ORDER — SULFAMETHOXAZOLE-TRIMETHOPRIM 800-160 MG PO TABS: 1.0000 | ORAL_TABLET | Freq: Two times a day (BID) | ORAL | 0 refills | Status: AC

## 2021-08-06 NOTE — Progress Notes (Signed)
Patient dropped of UA sample today for UA/UC.  Notified of antibiotic sent to pharmacy pending urine culture.

## 2021-08-08 LAB — URINE CULTURE

## 2021-08-09 ENCOUNTER — Other Ambulatory Visit: Payer: Self-pay

## 2021-08-09 ENCOUNTER — Ambulatory Visit (HOSPITAL_COMMUNITY)
Admission: RE | Admit: 2021-08-09 | Discharge: 2021-08-09 | Disposition: A | Discharge status: Home / Self Care | Payer: Medicare HMO | Source: Ambulatory Visit | Attending: Urology | Admitting: Urology

## 2021-08-09 DIAGNOSIS — N200 Calculus of kidney: Secondary | ICD-10-CM | POA: Diagnosis present

## 2021-08-11 ENCOUNTER — Encounter: Payer: Self-pay | Admitting: Urology

## 2021-08-11 ENCOUNTER — Telehealth: Payer: Self-pay

## 2021-08-11 NOTE — Telephone Encounter (Signed)
Please advise on KUB results.

## 2021-08-11 NOTE — Telephone Encounter (Signed)
Left a Voice Message:  Pt calling to get recent test results.  Please advise.  Call back:  [phone]  Thanks, Rosey Bath

## 2021-08-11 NOTE — Telephone Encounter (Signed)
Called patient. No answer. My chart message sent from wifes message earlier today.

## 2021-08-13 ENCOUNTER — Other Ambulatory Visit: Payer: Self-pay

## 2021-08-13 ENCOUNTER — Ambulatory Visit (HOSPITAL_COMMUNITY)
Admission: RE | Admit: 2021-08-13 | Discharge: 2021-08-13 | Disposition: A | Discharge status: Home / Self Care | Payer: Medicare HMO | Source: Ambulatory Visit | Attending: Urology | Admitting: Urology

## 2021-08-13 DIAGNOSIS — N200 Calculus of kidney: Secondary | ICD-10-CM | POA: Diagnosis not present

## 2021-09-13 ENCOUNTER — Telehealth: Payer: Self-pay

## 2021-09-13 ENCOUNTER — Other Ambulatory Visit: Payer: Self-pay | Admitting: Urology

## 2021-09-13 DIAGNOSIS — N200 Calculus of kidney: Secondary | ICD-10-CM

## 2021-09-13 NOTE — Telephone Encounter (Signed)
Patient called this morning requesting tx for kidney stone. Patient states the stone has given him trouble this weekend and he wish to go ahead and have it taken care of. Please advise.

## 2021-09-13 NOTE — Progress Notes (Signed)
Surgical Physician Order [facility]   * Scheduling expectation : ASAP 12/22 or 12/29  *Length of Case: 60 minutes  *MD Preforming Case: Wilkie Aye, MD  *Assistant Needed: no  *Facility Preference: [facility]  *Clearance needed: no  *Anticoagulation Instructions: N/A  *Aspirin Instructions: Ok to continue Aspirin  -Admit type: OUTpatient  -Anesthesia: General  -Use Standing Orders:  Ureteroscopy  *Diagnosis: Right Nephrolithiasis  *Procedure: right Ureteroscopy w/laser lithotripsy & stent placement (25366)  Additional orders: N/A  -Equipment: Holmium laser digital U scope, C-arm -VTE Prophylaxis Standing Order SCDs       Other:   -Standing Lab Orders Per Anesthesia    Lab other: None  -Standing Test orders EKG/Chest x-ray per Anesthesia       Test other:   - Medications:  Ancef 2gm IV  -Other orders:  PAS  *Post-op visit Date/Instructions:  1 week cysto stent removal

## 2021-09-13 NOTE — Telephone Encounter (Signed)
Patient complaining of right side pain.

## 2021-09-13 NOTE — Telephone Encounter (Signed)
Patient called and made aware.

## 2021-09-13 NOTE — Patient Instructions (Signed)
Thomas Alexander  09/13/2021     @PREFPERIOPPHARMACY @   Your procedure is scheduled on  09/16/2021.   Report to [facility] at   [place] [facility].    Call this number if you have problems the morning of surgery:  [phone]   Remember:  Do not eat or drink after midnight.      Take these medicines the morning of surgery with A SIP OF WATER              Celexa, zofran (if needed), oxycodone (if needed).     Do not wear jewelry, make-up or nail polish.  Do not wear lotions, powders, or perfumes, or deodorant.  Do not shave 48 hours prior to surgery.  Men may shave face and neck.  Do not bring valuables to the hospital.  [facility] is not responsible for any belongings or valuables.  Contacts, dentures or bridgework may not be worn into surgery.  Leave your suitcase in the car.  After surgery it may be brought to your room.  For patients admitted to the hospital, discharge time will be determined by your treatment team.  Patients discharged the day of surgery will not be allowed to drive home and must have someone with them for 24 hours.    Special instructions:   DO NOT smoke tobacco or vape for 24 hours before your procedure.  Please read over the following fact sheets that you were given. Coughing and Deep Breathing, Surgical Site Infection Prevention, Anesthesia Post-op Instructions, and Care and Recovery After Surgery      Ureteral Stent Implantation, Care After This sheet gives you information about how to care for yourself after your procedure. Your health care provider may also give you more specific instructions. If you have problems or questions, contact your health care provider. What can I expect after the procedure? After the procedure, it is common to have: Nausea. Mild pain when you urinate. You may feel this pain in your lower back or lower abdomen. The pain should stop within a few minutes after you urinate. This may last for up to 1 week. A  small amount of blood in your urine for several days. Follow these instructions at home: Medicines Take over-the-counter and prescription medicines only as told by your health care provider. If you were prescribed an antibiotic medicine, take it as told by your health care provider. Do not stop taking the antibiotic even if you start to feel better. Do not drive for 24 hours if you were given a sedative during your procedure. Ask your health care provider if the medicine prescribed to you requires you to avoid driving or using heavy machinery. Activity Rest as told by your health care provider. Avoid sitting for a long time without moving. Get up to take short walks every 1-2 hours. This is important to improve blood flow and breathing. Ask for help if you feel weak or unsteady. Return to your normal activities as told by your health care provider. Ask your health care provider what activities are safe for you. General instructions  Watch for any blood in your urine. Call your health care provider if the amount of blood in your urine increases. If you have a catheter: Follow instructions from your health care provider about taking care of your catheter and collection bag. Do not take baths, swim, or use a hot tub until your health care provider approves. Ask your health care provider  if you may take showers. You may only be allowed to take sponge baths. Drink enough fluid to keep your urine pale yellow. Do not use any products that contain nicotine or tobacco, such as cigarettes, e-cigarettes, and chewing tobacco. These can delay healing after surgery. If you need help quitting, ask your health care provider. Keep all follow-up visits as told by your health care provider. This is important. Contact a health care provider if: You have pain that gets worse or does not get better with medicine, especially pain when you urinate. You have difficulty urinating. You feel nauseous or you vomit  repeatedly during a period of more than 2 days after the procedure. Get help right away if: Your urine is dark red or has blood clots in it. You are leaking urine (have incontinence). The end of the stent comes out of your urethra. You cannot urinate. You have sudden, sharp, or severe pain in your abdomen or lower back. You have a fever. You have swelling or pain in your legs. You have difficulty breathing. Summary After the procedure, it is common to have mild pain when you urinate that goes away within a few minutes after you urinate. This may last for up to 1 week. Watch for any blood in your urine. Call your health care provider if the amount of blood in your urine increases. Take over-the-counter and prescription medicines only as told by your health care provider. Drink enough fluid to keep your urine pale yellow. This information is not intended to replace advice given to you by your health care provider. Make sure you discuss any questions you have with your health care provider. Document Revised: 06/19/2018 Document Reviewed: 06/20/2018 Elsevier Patient Education  2022 [place] Anesthesia, Adult, Care After This sheet gives you information about how to care for yourself after your procedure. Your health care provider may also give you more specific instructions. If you have problems or questions, contact your health care provider. What can I expect after the procedure? After the procedure, the following side effects are common: Pain or discomfort at the IV site. Nausea. Vomiting. Sore throat. Trouble concentrating. Feeling cold or chills. Feeling weak or tired. Sleepiness and fatigue. Soreness and body aches. These side effects can affect parts of the body that were not involved in surgery. Follow these instructions at home: For the time period you were told by your health care provider:  Rest. Do not participate in activities where you could fall or become  injured. Do not drive or use machinery. Do not drink alcohol. Do not take sleeping pills or medicines that cause drowsiness. Do not make important decisions or sign legal documents. Do not take care of children on your own. Eating and drinking Follow any instructions from your health care provider about eating or drinking restrictions. When you feel hungry, start by eating small amounts of foods that are soft and easy to digest (bland), such as toast. Gradually return to your regular diet. Drink enough fluid to keep your urine pale yellow. If you vomit, rehydrate by drinking water, juice, or clear broth. General instructions If you have sleep apnea, surgery and certain medicines can increase your risk for breathing problems. Follow instructions from your health care provider about wearing your sleep device: Anytime you are sleeping, including during daytime naps. While taking prescription pain medicines, sleeping medicines, or medicines that make you drowsy. Have a responsible adult stay with you for the time you are told. It is important to  have someone help care for you until you are awake and alert. Return to your normal activities as told by your health care provider. Ask your health care provider what activities are safe for you. Take over-the-counter and prescription medicines only as told by your health care provider. If you smoke, do not smoke without supervision. Keep all follow-up visits as told by your health care provider. This is important. Contact a health care provider if: You have nausea or vomiting that does not get better with medicine. You cannot eat or drink without vomiting. You have pain that does not get better with medicine. You are unable to pass urine. You develop a skin rash. You have a fever. You have redness around your IV site that gets worse. Get help right away if: You have difficulty breathing. You have chest pain. You have blood in your urine or stool,  or you vomit blood. Summary After the procedure, it is common to have a sore throat or nausea. It is also common to feel tired. Have a responsible adult stay with you for the time you are told. It is important to have someone help care for you until you are awake and alert. When you feel hungry, start by eating small amounts of foods that are soft and easy to digest (bland), such as toast. Gradually return to your regular diet. Drink enough fluid to keep your urine pale yellow. Return to your normal activities as told by your health care provider. Ask your health care provider what activities are safe for you. This information is not intended to replace advice given to you by your health care provider. Make sure you discuss any questions you have with your health care provider. Document Revised: 05/28/2020 Document Reviewed: 12/26/2019 Elsevier Patient Education  2022 [place]. How to Use Chlorhexidine for Bathing Chlorhexidine gluconate (CHG) is a germ-killing (antiseptic) solution that is used to clean the skin. It can get rid of the bacteria that normally live on the skin and can keep them away for about 24 hours. To clean your skin with CHG, you may be given: A CHG solution to use in the shower or as part of a sponge bath. A prepackaged cloth that contains CHG. Cleaning your skin with CHG may help lower the risk for infection: While you are staying in the intensive care unit of the hospital. If you have a vascular access, such as a central line, to provide short-term or long-term access to your veins. If you have a catheter to drain urine from your bladder. If you are on a ventilator. A ventilator is a machine that helps you breathe by moving air in and out of your lungs. After surgery. What are the risks? Risks of using CHG include: A skin reaction. Hearing loss, if CHG gets in your ears and you have a perforated eardrum. Eye injury, if CHG gets in your eyes and is not rinsed  out. The CHG product catching fire. Make sure that you avoid smoking and flames after applying CHG to your skin. Do not use CHG: If you have a chlorhexidine allergy or have previously reacted to chlorhexidine. On babies younger than 73 months of age. How to use CHG solution Use CHG only as told by your health care provider, and follow the instructions on the label. Use the full amount of CHG as directed. Usually, this is one bottle. During a shower Follow these steps when using CHG solution during a shower (unless your health care provider gives  you different instructions): Start the shower. Use your normal soap and shampoo to wash your face and hair. Turn off the shower or move out of the shower stream. Pour the CHG onto a clean washcloth. Do not use any type of brush or rough-edged sponge. Starting at your neck, lather your body down to your toes. Make sure you follow these instructions: If you will be having surgery, pay special attention to the part of your body where you will be having surgery. Scrub this area for at least 1 minute. Do not use CHG on your head or face. If the solution gets into your ears or eyes, rinse them well with water. Avoid your genital area. Avoid any areas of skin that have broken skin, cuts, or scrapes. Scrub your back and under your arms. Make sure to wash skin folds. Let the lather sit on your skin for 1-2 minutes or as long as told by your health care provider. Thoroughly rinse your entire body in the shower. Make sure that all body creases and crevices are rinsed well. Dry off with a clean towel. Do not put any substances on your body afterward--such as powder, lotion, or perfume--unless you are told to do so by your health care provider. Only use lotions that are recommended by the manufacturer. Put on clean clothes or pajamas. If it is the night before your surgery, sleep in clean sheets.  During a sponge bath Follow these steps when using CHG solution  during a sponge bath (unless your health care provider gives you different instructions): Use your normal soap and shampoo to wash your face and hair. Pour the CHG onto a clean washcloth. Starting at your neck, lather your body down to your toes. Make sure you follow these instructions: If you will be having surgery, pay special attention to the part of your body where you will be having surgery. Scrub this area for at least 1 minute. Do not use CHG on your head or face. If the solution gets into your ears or eyes, rinse them well with water. Avoid your genital area. Avoid any areas of skin that have broken skin, cuts, or scrapes. Scrub your back and under your arms. Make sure to wash skin folds. Let the lather sit on your skin for 1-2 minutes or as long as told by your health care provider. Using a different clean, wet washcloth, thoroughly rinse your entire body. Make sure that all body creases and crevices are rinsed well. Dry off with a clean towel. Do not put any substances on your body afterward--such as powder, lotion, or perfume--unless you are told to do so by your health care provider. Only use lotions that are recommended by the manufacturer. Put on clean clothes or pajamas. If it is the night before your surgery, sleep in clean sheets. How to use CHG prepackaged cloths Only use CHG cloths as told by your health care provider, and follow the instructions on the label. Use the CHG cloth on clean, dry skin. Do not use the CHG cloth on your head or face unless your health care provider tells you to. When washing with the CHG cloth: Avoid your genital area. Avoid any areas of skin that have broken skin, cuts, or scrapes. Before surgery Follow these steps when using a CHG cloth to clean before surgery (unless your health care provider gives you different instructions): Using the CHG cloth, vigorously scrub the part of your body where you will be having surgery. Scrub using a  back-and-forth motion for 3 minutes. The area on your body should be completely wet with CHG when you are done scrubbing. Do not rinse. Discard the cloth and let the area air-dry. Do not put any substances on the area afterward, such as powder, lotion, or perfume. Put on clean clothes or pajamas. If it is the night before your surgery, sleep in clean sheets.  For general bathing Follow these steps when using CHG cloths for general bathing (unless your health care provider gives you different instructions). Use a separate CHG cloth for each area of your body. Make sure you wash between any folds of skin and between your fingers and toes. Wash your body in the following order, switching to a new cloth after each step: The front of your neck, shoulders, and chest. Both of your arms, under your arms, and your hands. Your stomach and groin area, avoiding the genitals. Your right leg and foot. Your left leg and foot. The back of your neck, your back, and your buttocks. Do not rinse. Discard the cloth and let the area air-dry. Do not put any substances on your body afterward--such as powder, lotion, or perfume--unless you are told to do so by your health care provider. Only use lotions that are recommended by the manufacturer. Put on clean clothes or pajamas. Contact a health care provider if: Your skin gets irritated after scrubbing. You have questions about using your solution or cloth. You swallow any chlorhexidine. Call your local poison control center (1-908-098-0069 in the [place] Get help right away if: Your eyes itch badly, or they become very red or swollen. Your skin itches badly and is red or swollen. Your hearing changes. You have trouble seeing. You have swelling or tingling in your mouth or throat. You have trouble breathing. These symptoms may represent a serious problem that is an emergency. Do not wait to see if the symptoms will go away. Get medical help right away. Call your  local emergency services (911 in the [place] Do not drive yourself to the hospital. Summary Chlorhexidine gluconate (CHG) is a germ-killing (antiseptic) solution that is used to clean the skin. Cleaning your skin with CHG may help to lower your risk for infection. You may be given CHG to use for bathing. It may be in a bottle or in a prepackaged cloth to use on your skin. Carefully follow your health care provider's instructions and the instructions on the product label. Do not use CHG if you have a chlorhexidine allergy. Contact your health care provider if your skin gets irritated after scrubbing. This information is not intended to replace advice given to you by your health care provider. Make sure you discuss any questions you have with your health care provider. Document Revised: 11/23/2020 Document Reviewed: 11/23/2020 Elsevier Patient Education  2022 [employer].

## 2021-09-14 ENCOUNTER — Other Ambulatory Visit: Payer: Self-pay

## 2021-09-14 ENCOUNTER — Encounter (HOSPITAL_COMMUNITY): Payer: Self-pay

## 2021-09-14 ENCOUNTER — Encounter (HOSPITAL_COMMUNITY)
Admission: RE | Admit: 2021-09-14 | Discharge: 2021-09-14 | Disposition: A | Discharge status: Home / Self Care | Payer: Medicare HMO | Source: Ambulatory Visit | Attending: Urology | Admitting: Urology

## 2021-09-14 DIAGNOSIS — Z01810 Encounter for preprocedural cardiovascular examination: Secondary | ICD-10-CM | POA: Insufficient documentation

## 2021-09-14 NOTE — Progress Notes (Signed)
[facility] Surgery Posting Form   Surgery Date/Time: Date: 12/ 22/2022  Surgeon: Dr. Wilkie Aye, MD  Surgery Location: Day Surgery  Inpt ( No  )   Outpt (Yes)   Obs ( No  )   Diagnosis: N20.0 Right Nephrolithiasis  -CPT: [phone]  Surgery: Right Ureteroscopy with laser lithotripsy and Stent placement with right retrograde pyelogram  Stop Anticoagulations: N/A, may continue ASA  Cardiac/Medical/Pulmonary Clearance needed: no  *Orders entered into EPIC  Date: 09/14/21   *Case booked in [place]  Date: 09/13/2021  *Notified pt of Surgery: Date: 09/13/2021  *Placed into Prior Authorization Work Angela Nevin Date: 09/14/21   Assistant/laser/rep:No

## 2021-09-16 ENCOUNTER — Ambulatory Visit (HOSPITAL_COMMUNITY): Payer: Medicare HMO | Admitting: Anesthesiology

## 2021-09-16 ENCOUNTER — Encounter (HOSPITAL_COMMUNITY)
Admission: RE | Disposition: A | Discharge status: Home / Self Care | Payer: Self-pay | Source: Home / Self Care | Attending: Urology

## 2021-09-16 ENCOUNTER — Other Ambulatory Visit: Payer: Self-pay

## 2021-09-16 ENCOUNTER — Encounter (HOSPITAL_COMMUNITY): Payer: Self-pay | Admitting: Urology

## 2021-09-16 ENCOUNTER — Ambulatory Visit (HOSPITAL_COMMUNITY): Payer: Medicare HMO

## 2021-09-16 ENCOUNTER — Ambulatory Visit (HOSPITAL_COMMUNITY)
Admission: RE | Admit: 2021-09-16 | Discharge: 2021-09-16 | Disposition: A | Discharge status: Home / Self Care | Payer: Medicare HMO | Attending: Urology | Admitting: Urology

## 2021-09-16 DIAGNOSIS — N202 Calculus of kidney with calculus of ureter: Secondary | ICD-10-CM | POA: Diagnosis not present

## 2021-09-16 DIAGNOSIS — N132 Hydronephrosis with renal and ureteral calculous obstruction: Secondary | ICD-10-CM | POA: Diagnosis not present

## 2021-09-16 DIAGNOSIS — G4730 Sleep apnea, unspecified: Secondary | ICD-10-CM | POA: Insufficient documentation

## 2021-09-16 DIAGNOSIS — Z7984 Long term (current) use of oral hypoglycemic drugs: Secondary | ICD-10-CM | POA: Diagnosis not present

## 2021-09-16 DIAGNOSIS — N200 Calculus of kidney: Secondary | ICD-10-CM

## 2021-09-16 DIAGNOSIS — N1330 Unspecified hydronephrosis: Secondary | ICD-10-CM | POA: Diagnosis not present

## 2021-09-16 DIAGNOSIS — Z87442 Personal history of urinary calculi: Secondary | ICD-10-CM | POA: Insufficient documentation

## 2021-09-16 DIAGNOSIS — E119 Type 2 diabetes mellitus without complications: Secondary | ICD-10-CM | POA: Diagnosis not present

## 2021-09-16 DIAGNOSIS — F17210 Nicotine dependence, cigarettes, uncomplicated: Secondary | ICD-10-CM | POA: Diagnosis not present

## 2021-09-16 DIAGNOSIS — N201 Calculus of ureter: Secondary | ICD-10-CM | POA: Diagnosis present

## 2021-09-16 HISTORY — PX: CYSTOSCOPY/URETEROSCOPY/HOLMIUM LASER/STENT PLACEMENT: SHX6546

## 2021-09-16 LAB — GLUCOSE, CAPILLARY: Glucose-Capillary: 105 mg/dL — ABNORMAL HIGH (ref 70–99)

## 2021-09-16 SURGERY — CYSTOSCOPY/URETEROSCOPY/HOLMIUM LASER/STENT PLACEMENT
Anesthesia: General | Site: Ureter | Laterality: Right

## 2021-09-16 MED ORDER — CHLORHEXIDINE GLUCONATE 0.12 % MT SOLN
15.0000 mL | Freq: Once | OROMUCOSAL | Status: AC
  Administered 2021-09-16: 07:00:00 15 mL via OROMUCOSAL

## 2021-09-16 MED ORDER — MEPERIDINE HCL 50 MG/ML IJ SOLN: 6.2500 mg | INTRAMUSCULAR | Status: DC | PRN

## 2021-09-16 MED ORDER — PROPOFOL 10 MG/ML IV BOLUS
INTRAVENOUS | Status: DC | PRN
  Administered 2021-09-16: 200 mg via INTRAVENOUS

## 2021-09-16 MED ORDER — DEXAMETHASONE SODIUM PHOSPHATE 10 MG/ML IJ SOLN
INTRAMUSCULAR | Status: AC
  Filled 2021-09-16: qty 1

## 2021-09-16 MED ORDER — PROPOFOL 10 MG/ML IV BOLUS
INTRAVENOUS | Status: AC
  Filled 2021-09-16: qty 20

## 2021-09-16 MED ORDER — MIDAZOLAM HCL 2 MG/2ML IJ SOLN
INTRAMUSCULAR | Status: AC
  Filled 2021-09-16: qty 2

## 2021-09-16 MED ORDER — CEFAZOLIN SODIUM-DEXTROSE 2-4 GM/100ML-% IV SOLN
2.0000 g | INTRAVENOUS | Status: AC
  Administered 2021-09-16: 08:00:00 2 g via INTRAVENOUS
  Filled 2021-09-16: qty 100

## 2021-09-16 MED ORDER — ORAL CARE MOUTH RINSE: 15.0000 mL | Freq: Once | OROMUCOSAL | Status: AC

## 2021-09-16 MED ORDER — HYDROMORPHONE HCL 1 MG/ML IJ SOLN: 0.2500 mg | INTRAMUSCULAR | Status: DC | PRN

## 2021-09-16 MED ORDER — DIATRIZOATE MEGLUMINE 30 % UR SOLN
URETHRAL | Status: AC
  Filled 2021-09-16: qty 100

## 2021-09-16 MED ORDER — ONDANSETRON HCL 4 MG/2ML IJ SOLN
INTRAMUSCULAR | Status: DC | PRN
  Administered 2021-09-16: 4 mg via INTRAVENOUS

## 2021-09-16 MED ORDER — LACTATED RINGERS IV SOLN: INTRAVENOUS | Status: DC

## 2021-09-16 MED ORDER — ONDANSETRON 4 MG PO TBDP: 4.0000 mg | ORAL_TABLET | Freq: Four times a day (QID) | ORAL | 0 refills | Status: AC | PRN

## 2021-09-16 MED ORDER — DEXAMETHASONE SODIUM PHOSPHATE 10 MG/ML IJ SOLN
INTRAMUSCULAR | Status: DC | PRN
  Administered 2021-09-16: 5 mg via INTRAVENOUS

## 2021-09-16 MED ORDER — DIATRIZOATE MEGLUMINE 30 % UR SOLN
URETHRAL | Status: DC | PRN
  Administered 2021-09-16: 08:00:00 5 mL via URETHRAL

## 2021-09-16 MED ORDER — PHENYLEPHRINE 40 MCG/ML (10ML) SYRINGE FOR IV PUSH (FOR BLOOD PRESSURE SUPPORT)
PREFILLED_SYRINGE | INTRAVENOUS | Status: DC | PRN
  Administered 2021-09-16 (×3): 80 ug via INTRAVENOUS

## 2021-09-16 MED ORDER — FENTANYL CITRATE (PF) 100 MCG/2ML IJ SOLN
INTRAMUSCULAR | Status: AC
  Filled 2021-09-16: qty 2

## 2021-09-16 MED ORDER — ONDANSETRON HCL 4 MG/2ML IJ SOLN
INTRAMUSCULAR | Status: AC
  Filled 2021-09-16: qty 2

## 2021-09-16 MED ORDER — MIDAZOLAM HCL 5 MG/5ML IJ SOLN
INTRAMUSCULAR | Status: DC | PRN
  Administered 2021-09-16: 2 mg via INTRAVENOUS

## 2021-09-16 MED ORDER — LIDOCAINE 2% (20 MG/ML) 5 ML SYRINGE
INTRAMUSCULAR | Status: DC | PRN
  Administered 2021-09-16: 100 mg via INTRAVENOUS

## 2021-09-16 MED ORDER — WATER FOR IRRIGATION, STERILE IR SOLN
Status: DC | PRN
  Administered 2021-09-16: 500 mL

## 2021-09-16 MED ORDER — OXYCODONE-ACETAMINOPHEN 5-325 MG PO TABS: 1.0000 | ORAL_TABLET | ORAL | 0 refills | Status: AC | PRN

## 2021-09-16 MED ORDER — LIDOCAINE HCL (PF) 2 % IJ SOLN
INTRAMUSCULAR | Status: AC
  Filled 2021-09-16: qty 5

## 2021-09-16 MED ORDER — ONDANSETRON HCL 4 MG/2ML IJ SOLN: 4.0000 mg | Freq: Once | INTRAMUSCULAR | Status: DC | PRN

## 2021-09-16 MED ORDER — SODIUM CHLORIDE 0.9 % IR SOLN
Status: DC | PRN
  Administered 2021-09-16: 3000 mL

## 2021-09-16 MED ORDER — FENTANYL CITRATE (PF) 100 MCG/2ML IJ SOLN
INTRAMUSCULAR | Status: DC | PRN
  Administered 2021-09-16 (×2): 25 ug via INTRAVENOUS

## 2021-09-16 SURGICAL SUPPLY — 25 items
BAG DRAIN URO TABLE W/ADPT NS (BAG) ×3 IMPLANT
BAG DRN 8 ADPR NS SKTRN CSTL (BAG) ×1
BAG HAMPER (MISCELLANEOUS) ×3 IMPLANT
CATH INTERMIT  6FR 70CM (CATHETERS) ×3 IMPLANT
CLOTH BEACON ORANGE TIMEOUT ST (SAFETY) ×3 IMPLANT
EXTRACTOR STONE NITINOL NGAGE (UROLOGICAL SUPPLIES) ×3 IMPLANT
GLOVE SURG POLYISO LF SZ8 (GLOVE) ×3 IMPLANT
GLOVE SURG UNDER POLY LF SZ7 (GLOVE) ×6 IMPLANT
GOWN STRL REUS W/TWL LRG LVL3 (GOWN DISPOSABLE) ×3 IMPLANT
GOWN STRL REUS W/TWL XL LVL3 (GOWN DISPOSABLE) ×3 IMPLANT
GUIDEWIRE ANG ZIPWIRE 038X150 (WIRE) ×3 IMPLANT
GUIDEWIRE STR DUAL SENSOR (WIRE) ×3 IMPLANT
IV NS IRRIG 3000ML ARTHROMATIC (IV SOLUTION) ×6 IMPLANT
KIT TURNOVER CYSTO (KITS) ×3 IMPLANT
LASER FIBER DISP (UROLOGICAL SUPPLIES) IMPLANT
MANIFOLD NEPTUNE II (INSTRUMENTS) ×3 IMPLANT
PACK CYSTO (CUSTOM PROCEDURE TRAY) ×3 IMPLANT
PAD ARMBOARD 7.5X6 YLW CONV (MISCELLANEOUS) ×3 IMPLANT
SHEATH URETERAL 12FRX35CM (MISCELLANEOUS) IMPLANT
STENT URET 6FRX26 CONTOUR (STENTS) ×2 IMPLANT
SYR 10ML LL (SYRINGE) ×3 IMPLANT
TOWEL NATURAL 4PK STERILE (DISPOSABLE) ×3 IMPLANT
TRACTIP FLEXIVA PULS ID 200XHI (Laser) IMPLANT
TRACTIP FLEXIVA PULSE ID 200 (Laser)
WATER STERILE IRR 500ML POUR (IV SOLUTION) ×3 IMPLANT

## 2021-09-16 NOTE — Op Note (Signed)
Preoperative diagnosis: Right ureteral stone  Postoperative diagnosis: Same  Procedure: 1 cystoscopy 2 right retrograde pyelography 3.  Intraoperative fluoroscopy, under one hour, with interpretation 4.  Right ureteroscopic stone manipulation with basket extraction 5.  Right 6 x 26 JJ stent placement  Attending: Cleda Mccreedy  Anesthesia: General  Estimated blood loss: None  Drains: Right 6 x 26 JJ ureteral stent with tether  Specimens: stone for analysis  Antibiotics: ancef  Findings: multiple 1-107mm right distal ureteral calculi. 27mm right mid pole renal calculus. Mild right hydronephrosis  Indications: Patient is a 50 year old male with a history of a right ureteral stone and who has failed medical expulsive therapy.  After discussing treatment options, he decided proceed with right ureteroscopic stone manipulation.  Procedure in detail: The patient was brought to the operating room and a brief timeout was done to ensure correct patient, correct procedure, correct site.  General anesthesia was administered patient was placed in dorsal lithotomy position.  His genitalia was then prepped and draped in usual sterile fashion.  A rigid 22 French cystoscope was passed in the urethra and the bladder.  Bladder was inspected free masses or lesions.  the right ureteral orifices were in the normal orthotopic locations.  a 6 french ureteral catheter was then instilled into the right ureter orifice.  a gentle retrograde was obtained and findings noted above.  we then placed a zip wire through the ureteral catheter and advanced up to the renal pelvis.  we then removed the cystoscope and cannulated the right ureteral orifice with a semirigid ureteroscope.  We then encountered multiple small calculi in the mid to distal ureter which were removed with an NGage basket. We then advanced the ureteroscope to the UPJ and then advanced a sensor wire into the renal pelvis. We then removed the scope and  advanced a flexible ureteroscope up to the renal pelvis. We performed nephroscopy and encountered a 3-45mm calculus in the right mid pole which was removed with an NGage basket. We then placed a 6 x 26 double-j ureteral stent over the original zip wire. We then removed the wire and good coil was noted in the the renal pelvis under fluoroscopy and the bladder under direct vision.      the bladder was then drained and this concluded the procedure which was well tolerated by patient.  Complications: None  Condition: Stable, extubated, transferred to PACU  Plan: Patient is to be discharged home as to follow-up in one week. They are to remove their stent by pulling the tether in 72 hours.

## 2021-09-16 NOTE — Anesthesia Postprocedure Evaluation (Signed)
Anesthesia Post Note  Patient: Thomas Alexander  Procedure(s) Performed: CYSTOSCOPY/URETEROSCOPY/STENT PLACEMENT (Right: Ureter)  Patient location during evaluation: PACU Anesthesia Type: General Level of consciousness: awake and alert and oriented Pain management: pain level controlled Vital Signs Assessment: post-procedure vital signs reviewed and stable Respiratory status: spontaneous breathing, nonlabored ventilation and respiratory function stable Cardiovascular status: blood pressure returned to baseline and stable Postop Assessment: no apparent nausea or vomiting Anesthetic complications: no   No notable events documented.   Last Vitals:  Vitals:   09/16/21 0900 09/16/21 0922  BP: 108/72 123/78  Pulse: 66 72  Resp: 17 16  Temp:  36.7 C  SpO2: 95% 96%    Last Pain:  Vitals:   09/16/21 0922  TempSrc: Oral  PainSc: 0-No pain                 Ivana Nicastro C Reiley Keisler

## 2021-09-16 NOTE — H&P (Signed)
Urology Admission H&P  Chief Complaint: right flank pain  History of Present Illness: Thomas Alexander is a 50yo with a history of nephrolithiasis who had new onset right flank pain for 1 week. He passes several calculi per week but for the past week has had continued pain and not passed his calculus. No fevers/chills. No worsening LUTS  Past Medical History:  Diagnosis Date   Anxiety    Crohn disease (HCC) 2008   Diabetes mellitus without complication (HCC)    Sleep apnea    Past Surgical History:  Procedure Laterality Date   COLECTOMY     x 7 for crohns   COLONOSCOPY     CYSTOSCOPY  2020   CYSTOSCOPY W/ URETERAL STENT REMOVAL  07/29/2021   Procedure: CYSTOSCOPY WITH RIGHT URETERAL STENT REMOVAL;  Surgeon: Malen Gauze, MD;  Location: [facility];  Service: Urology;;   CYSTOSCOPY WITH RETROGRADE PYELOGRAM, URETEROSCOPY AND STENT PLACEMENT Right 07/15/2021   Procedure: CYSTOSCOPY WITH RETROGRADE PYELOGRAM, URETEROSCOPY AND STENT PLACEMENT;  Surgeon: Malen Gauze, MD;  Location: [facility];  Service: Urology;  Laterality: Right;   CYSTOSCOPY WITH RETROGRADE PYELOGRAM, URETEROSCOPY AND STENT PLACEMENT Left 07/29/2021   Procedure: CYSTOSCOPY WITH LEFT RETROGRADE PYELOGRAM, LEFT URETEROSCOPY AND LEFT URETERAL STENT PLACEMENT;  Surgeon: Malen Gauze, MD;  Location: [facility];  Service: Urology;  Laterality: Left;   HAND SURGERY Left    removal of cyst   HOLMIUM LASER APPLICATION Right 07/15/2021   Procedure: HOLMIUM LASER APPLICATION;  Surgeon: Malen Gauze, MD;  Location: [facility];  Service: Urology;  Laterality: Right;   HOLMIUM LASER APPLICATION Left 07/29/2021   Procedure: HOLMIUM LASER APPLICATION;  Surgeon: Malen Gauze, MD;  Location: [facility];  Service: Urology;  Laterality: Left;   KNEE SURGERY Left    STONE EXTRACTION WITH BASKET Left 07/29/2021   Procedure: STONE EXTRACTION WITH BASKET;  Surgeon: Malen Gauze, MD;  Location: [facility];  Service: Urology;   Laterality: Left;    Home Medications:  Current Facility-Administered Medications  Medication Dose Route Frequency Provider Last Rate Last Admin   ceFAZolin (ANCEF) IVPB 2g/100 mL premix  2 g Intravenous 30 min Pre-Op Phelan Schadt, Mardene Celeste, MD       lactated ringers infusion   Intravenous Continuous Molli Barrows, MD 50 mL/hr at 09/16/21 0706 New Bag at 09/16/21 0706   Allergies:  Allergies  Allergen Reactions   Nsaids Other (See Comments)    Crohns Disease; takes Humira   Fish Oil Other (See Comments)    Other Reaction: BURNING IN THROAT, STOMACH - pt unaware of this reaction    Family History  Family history unknown: Yes   Social History:  reports that he has been smoking cigarettes. He has been smoking an average of .5 packs per day. He does not have any smokeless tobacco history on file. He reports that he does not currently use drugs after having used the following drugs: Marijuana. No history on file for alcohol use.  Review of Systems  Genitourinary:  Positive for flank pain.  All other systems reviewed and are negative.  Physical Exam:  Vital signs in last 24 hours: Temp:  [98.3 F (36.8 C)] 98.3 F (36.8 C) (12/22 0706) Pulse Rate:  [64] 64 (12/22 0706) Resp:  [19] 19 (12/22 0706) BP: (125)/(75) 125/75 (12/22 0706) SpO2:  [97 %] 97 % (12/22 0706) Physical Exam Constitutional:      Appearance: Normal appearance.  HENT:     Head: Normocephalic and  atraumatic.     Nose: Nose normal.     Mouth/Throat:     Mouth: Mucous membranes are dry.  Eyes:     Extraocular Movements: Extraocular movements intact.     Pupils: Pupils are equal, round, and reactive to light.  Cardiovascular:     Rate and Rhythm: Normal rate and regular rhythm.  Pulmonary:     Effort: Pulmonary effort is normal. No respiratory distress.  Abdominal:     General: Abdomen is flat. There is no distension.  Musculoskeletal:        General: No swelling. Normal range of motion.     Cervical  back: Normal range of motion and neck supple.  Skin:    General: Skin is warm and dry.  Neurological:     General: No focal deficit present.     Mental Status: He is alert and oriented to person, place, and time.  Psychiatric:        Mood and Affect: Mood normal.        Behavior: Behavior normal.        Thought Content: Thought content normal.        Judgment: Judgment normal.    Laboratory Data:  Results for orders placed or performed during the hospital encounter of 09/16/21 (from the past 24 hour(s))  Glucose, capillary     Status: Abnormal   Collection Time: 09/16/21  6:47 AM  Result Value Ref Range   Glucose-Capillary 105 (H) 70 - 99 mg/dL   No results found for this or any previous visit (from the past 240 hour(s)). Creatinine: No results for input(s): CREATININE in the last 168 hours. Baseline Creatinine: unknwon  Impression/Assessment:  50yo with right flank pain and nephrolithiasis  Plan:  -We discussed the management of kidney stones. These options include observation, ureteroscopy, shockwave lithotripsy (ESWL) and percutaneous nephrolithotomy (PCNL). We discussed which options are relevant to the patient's stone(s). We discussed the natural history of kidney stones as well as the complications of untreated stones and the impact on quality of life without treatment as well as with each of the above listed treatments. We also discussed the efficacy of each treatment in its ability to clear the stone burden. With any of these management options I discussed the signs and symptoms of infection and the need for emergent treatment should these be experienced. For each option we discussed the ability of each procedure to clear the patient of their stone burden.   For observation I described the risks which include but are not limited to silent renal damage, life-threatening infection, need for emergent surgery, failure to pass stone and pain.   For ureteroscopy I described the  risks which include bleeding, infection, damage to contiguous structures, positioning injury, ureteral stricture, ureteral avulsion, ureteral injury, need for prolonged ureteral stent, inability to perform ureteroscopy, need for an interval procedure, inability to clear stone burden, stent discomfort/pain, heart attack, stroke, pulmonary embolus and the inherent risks with general anesthesia.   For shockwave lithotripsy I described the risks which include arrhythmia, kidney contusion, kidney hemorrhage, need for transfusion, pain, inability to adequately break up stone, inability to pass stone fragments, Steinstrasse, infection associated with obstructing stones, need for alternate surgical procedure, need for repeat shockwave lithotripsy, MI, CVA, PE and the inherent risks with anesthesia/conscious sedation.   For PCNL I described the risks including positioning injury, pneumothorax, hydrothorax, need for chest tube, inability to clear stone burden, renal laceration, arterial venous fistula or malformation, need for embolization of kidney,  loss of kidney or renal function, need for repeat procedure, need for prolonged nephrostomy tube, ureteral avulsion, MI, CVA, PE and the inherent risks of general anesthesia.   - The patient would like to proceed with right ureteroscopic stone extraction  Nicolette Bang 09/16/2021, 7:41 AM

## 2021-09-16 NOTE — Anesthesia Preprocedure Evaluation (Signed)
Anesthesia Evaluation  Patient identified by MRN, date of birth, ID band Patient awake    Reviewed: Allergy & Precautions, NPO status , Patient's Chart, lab work & pertinent test results  History of Anesthesia Complications Negative for: history of anesthetic complications  Airway Mallampati: III  TM Distance: >3 FB Neck ROM: Full    Dental  (+) Dental Advisory Given, Teeth Intact   Pulmonary sleep apnea and Continuous Positive Airway Pressure Ventilation , Current Smoker and Patient abstained from smoking.,    Pulmonary exam normal breath sounds clear to auscultation       Cardiovascular Exercise Tolerance: Good negative cardio ROS Normal cardiovascular exam Rhythm:Regular Rate:Normal     Neuro/Psych PSYCHIATRIC DISORDERS Anxiety Depression negative neurological ROS     GI/Hepatic negative GI ROS, Neg liver ROS,   Endo/Other  diabetes, Well Controlled, Type 2, Oral Hypoglycemic Agents  Renal/GU negative Renal ROS  negative genitourinary   Musculoskeletal negative musculoskeletal ROS (+)   Abdominal   Peds negative pediatric ROS (+)  Hematology negative hematology ROS (+)   Anesthesia Other Findings   Reproductive/Obstetrics negative OB ROS                             Anesthesia Physical  Anesthesia Plan  ASA: 2  Anesthesia Plan: General   Post-op Pain Management:    Induction: Intravenous  PONV Risk Score and Plan: Ondansetron  Airway Management Planned: LMA and Oral ETT  Additional Equipment:   Intra-op Plan:   Post-operative Plan: Extubation in OR  Informed Consent: I have reviewed the patients History and Physical, chart, labs and discussed the procedure including the risks, benefits and alternatives for the proposed anesthesia with the patient or authorized representative who has indicated his/her understanding and acceptance.     Dental advisory given  Plan  Discussed with: CRNA and Surgeon  Anesthesia Plan Comments:         Anesthesia Quick Evaluation

## 2021-09-16 NOTE — Anesthesia Procedure Notes (Signed)
Procedure Name: LMA Insertion Date/Time: 09/16/2021 8:01 AM Performed by: Lucinda Dell, CRNA Pre-anesthesia Checklist: Patient identified, Emergency Drugs available, Patient being monitored and Suction available Patient Re-evaluated:Patient Re-evaluated prior to induction Oxygen Delivery Method: Circle system utilized Preoxygenation: Pre-oxygenation with 100% oxygen Induction Type: IV induction Ventilation: Mask ventilation without difficulty LMA: LMA inserted LMA Size: 5.0 Number of attempts: 1 Placement Confirmation: positive ETCO2 and breath sounds checked- equal and bilateral Tube secured with: Tape Dental Injury: Teeth and Oropharynx as per pre-operative assessment

## 2021-09-16 NOTE — Transfer of Care (Signed)
Immediate Anesthesia Transfer of Care Note  Patient: Thomas Alexander  Procedure(s) Performed: CYSTOSCOPY/URETEROSCOPY/STENT PLACEMENT (Right: Ureter)  Patient Location: PACU  Anesthesia Type:General  Level of Consciousness: sedated and patient cooperative  Airway & Oxygen Therapy: Patient Spontanous Breathing and Patient connected to nasal cannula oxygen  Post-op Assessment: Report given to RN, Post -op Vital signs reviewed and stable and Patient moving all extremities  Post vital signs: Reviewed and stable  Last Vitals:  Vitals Value Taken Time  BP 114/78 09/16/21 0845  Temp 36.7 C 09/16/21 0843  Pulse 70 09/16/21 0846  Resp 20 09/16/21 0846  SpO2 95 % 09/16/21 0846  Vitals shown include unvalidated device data.  Last Pain: There were no vitals filed for this visit.       Complications: No notable events documented.

## 2021-09-17 ENCOUNTER — Encounter (HOSPITAL_COMMUNITY): Payer: Self-pay | Admitting: Urology

## 2021-09-21 ENCOUNTER — Telehealth: Payer: Self-pay

## 2021-09-21 NOTE — Telephone Encounter (Signed)
Please reschedule for sometime first week of January.

## 2021-09-21 NOTE — Telephone Encounter (Signed)
Patient called and advised that he tested positive for Covid 09/18/21. He advised that he needed to reschedule his appt for tomorrow 09/22/2021 for Stent removal.

## 2021-09-22 ENCOUNTER — Encounter: Payer: Medicare HMO | Admitting: Urology

## 2021-09-24 LAB — CALCULI, WITH PHOTOGRAPH (CLINICAL LAB)
Calcium Oxalate Dihydrate: 30 %
Calcium Oxalate Monohydrate: 70 %
Size Calculi: <1 mm
Weight Calculi: <1 mg

## 2021-09-29 ENCOUNTER — Other Ambulatory Visit: Payer: Self-pay | Admitting: Urology

## 2021-09-29 ENCOUNTER — Ambulatory Visit (INDEPENDENT_AMBULATORY_CARE_PROVIDER_SITE_OTHER): Payer: Medicare HMO | Admitting: Urology

## 2021-09-29 ENCOUNTER — Other Ambulatory Visit: Payer: Self-pay

## 2021-09-29 VITALS — BP 117/69 | HR 69

## 2021-09-29 DIAGNOSIS — R8281 Pyuria: Secondary | ICD-10-CM | POA: Insufficient documentation

## 2021-09-29 DIAGNOSIS — N200 Calculus of kidney: Secondary | ICD-10-CM | POA: Diagnosis not present

## 2021-09-29 LAB — MICROSCOPIC EXAMINATION
Renal Epithel, UA: NONE SEEN /hpf
WBC, UA: >30 /hpf — AB (ref 0–5)

## 2021-09-29 LAB — URINALYSIS, ROUTINE W REFLEX MICROSCOPIC
Bilirubin, UA: NEGATIVE
Ketones, UA: NEGATIVE
Nitrite, UA: NEGATIVE
Protein,UA: NEGATIVE
Specific Gravity, UA: 1.01 (ref 1.005–1.030)
Urobilinogen, Ur: 0.2 mg/dL (ref 0.2–1.0)
pH, UA: 6 (ref 5.0–7.5)

## 2021-09-29 LAB — BLADDER SCAN AMB NON-IMAGING

## 2021-09-29 MED ORDER — NITROFURANTOIN MONOHYD MACRO 100 MG PO CAPS: 100.0000 mg | ORAL_CAPSULE | Freq: Two times a day (BID) | ORAL | 0 refills | Status: AC

## 2021-09-29 NOTE — Progress Notes (Signed)
Urological Symptom Review  Patient is experiencing the following symptoms: Burning/pain with urination Get up at night to urinate   Review of Systems  Gastrointestinal (upper)  : Negative for upper GI symptoms  Gastrointestinal (lower) : Negative for lower GI symptoms  Constitutional : Negative for symptoms  Skin: Negative for skin symptoms  Eyes: Negative for eye symptoms  Ear/Nose/Throat : Negative for Ear/Nose/Throat symptoms  Hematologic/Lymphatic: Negative for Hematologic/Lymphatic symptoms  Cardiovascular : Negative for cardiovascular symptoms  Respiratory : Negative for respiratory symptoms  Endocrine: Negative for endocrine symptoms  Musculoskeletal: Back pain Joint pain  Neurological: Negative for neurological symptoms  Psychologic: Negative for psychiatric symptoms

## 2021-09-29 NOTE — Progress Notes (Signed)
09/29/2021 3:15 PM   Thomas Alexander [birth year] [id number]  Referring provider: Allie Dimmer, [address] [place] [address]  No chief complaint on file. Nephrolithiasis  HPI: Pt is a 51YO male who presents for post op evaluation. Date of Procedure: 09/16/21 1 cystoscopy 2 right retrograde pyelography 3.  Intraoperative fluoroscopy, under one hour, with interpretation 4.  Right ureteroscopic stone manipulation with basket extraction 5.  Right 6 x 26 JJ stent placement Pt has pulled his tethered stent. He continues to c/o some urinary burning and nocturia 4-6 x/night. Pt states burning improving since stent removal, but he has had nocturia for awhile. Pt also admits to significant urgency since stent removal. UA >30WBC, 3-10RBC, few bact, yeast PVR 119  PMH: Past Medical History:  Diagnosis Date   Anxiety    Crohn disease ([place]) 2008   Diabetes mellitus without complication ([place])    Sleep apnea     Surgical History: Past Surgical History:  Procedure Laterality Date   COLECTOMY     x 7 for crohns   COLONOSCOPY     CYSTOSCOPY  2020   CYSTOSCOPY W/ URETERAL STENT REMOVAL  07/29/2021   Procedure: CYSTOSCOPY WITH RIGHT URETERAL STENT REMOVAL;  Surgeon: Cleon Gustin, MD;  Location: [facility];  Service: Urology;;   [place], URETEROSCOPY AND STENT PLACEMENT Right 07/15/2021   Procedure: [place], URETEROSCOPY AND STENT PLACEMENT;  Surgeon: Cleon Gustin, MD;  Location: [facility];  Service: Urology;  Laterality: Right;   CYSTOSCOPY WITH RETROGRADE PYELOGRAM, URETEROSCOPY AND STENT PLACEMENT Left 07/29/2021   Procedure: CYSTOSCOPY WITH LEFT RETROGRADE PYELOGRAM, LEFT URETEROSCOPY AND LEFT URETERAL STENT PLACEMENT;  Surgeon: Cleon Gustin, MD;  Location: [facility];  Service: Urology;  Laterality: Left;   CYSTOSCOPY/URETEROSCOPY/HOLMIUM LASER/STENT PLACEMENT Right 09/16/2021   Procedure:  CYSTOSCOPY/URETEROSCOPY/STENT PLACEMENT;  Surgeon: Cleon Gustin, MD;  Location: [facility];  Service: Urology;  Laterality: Right;   HAND SURGERY Left    removal of cyst   HOLMIUM LASER APPLICATION Right [id number]   Procedure: HOLMIUM LASER APPLICATION;  Surgeon: Cleon Gustin, MD;  Location: [facility];  Service: Urology;  Laterality: Right;   HOLMIUM LASER APPLICATION Left [id number]   Procedure: HOLMIUM LASER APPLICATION;  Surgeon: Cleon Gustin, MD;  Location: [facility];  Service: Urology;  Laterality: Left;   KNEE SURGERY Left    STONE EXTRACTION WITH BASKET Left 07/29/2021   Procedure: STONE EXTRACTION WITH BASKET;  Surgeon: Cleon Gustin, MD;  Location: [facility];  Service: Urology;  Laterality: Left;    Home Medications:  Allergies as of 09/29/2021       Reactions   Nsaids Other (See Comments)   Crohns Disease; takes Humira   Fish Oil Other (See Comments)   Other Reaction: BURNING IN THROAT, STOMACH - pt unaware of this reaction        Medication List        Accurate as of September 29, 2021  3:15 PM. If you have any questions, ask your nurse or doctor.          atenolol 50 MG tablet Commonly known as: TENORMIN Take 50 mg by mouth daily.   cetirizine 10 MG tablet Commonly known as: ZYRTEC Take 10 mg by mouth daily as needed for allergies.   citalopram 20 MG tablet Commonly known as: CELEXA Take 20 mg by mouth daily.   cyanocobalamin 1000 MCG/ML injection Commonly known as: (VITAMIN B-12) Inject 1,000 mcg into  the muscle every 30 (thirty) days.   dicyclomine 10 MG capsule Commonly known as: BENTYL Take 20 mg by mouth 4 (four) times daily -  before meals and at bedtime.   glimepiride 4 MG tablet Commonly known as: AMARYL Take 4 mg by mouth 2 (two) times daily with a meal.   Humira Pen 40 MG/0.4ML Pnkt Generic drug: Adalimumab Inject 40 mg into the skin once a week.   Jardiance 25 MG Tabs tablet Generic drug: empagliflozin Take 25 mg by mouth  every morning.   lovastatin 10 MG tablet Commonly known as: MEVACOR Take 10 mg by mouth at bedtime.   magnesium oxide 400 MG tablet Commonly known as: MAG-OX Take 400 mg by mouth daily.   ondansetron 4 MG disintegrating tablet Commonly known as: ZOFRAN-ODT Take 1 tablet (4 mg total) by mouth every 6 (six) hours as needed.   oxyCODONE-acetaminophen 5-325 MG tablet Commonly known as: Percocet Take 1 tablet by mouth every 4 (four) hours as needed for severe pain.   potassium citrate 10 MEQ (1080 MG) SR tablet Commonly known as: UROCIT-K Take 20 mEq by mouth in the morning and at bedtime.   sulfamethoxazole-trimethoprim 800-160 MG tablet Commonly known as: BACTRIM DS Take 1 tablet by mouth every 12 (twelve) hours.   tamsulosin 0.4 MG Caps capsule Commonly known as: FLOMAX Take 1 capsule (0.4 mg total) by mouth daily after supper.   TUMS ULTRA 1000 PO Take 1 tablet by mouth 3 (three) times daily as needed (heartburn).   UltiCare Tuberculin Safety Syr 25G X 5/8" 1 ML Misc Generic drug: TUBERCULIN SYR 1CC/25GX5/8"   Vitamin D (Ergocalciferol) 1.25 MG (50000 UNIT) Caps capsule Commonly known as: DRISDOL Take 50,000 Units by mouth every 7 (seven) days.        Allergies:  Allergies  Allergen Reactions   Nsaids Other (See Comments)    Crohns Disease; takes Humira   Fish Oil Other (See Comments)    Other Reaction: BURNING IN THROAT, STOMACH - pt unaware of this reaction    Family History: Family History  Family history unknown: Yes    Social History:  reports that he has been smoking cigarettes. He has been smoking an average of .5 packs per day. He does not have any smokeless tobacco history on file. He reports that he does not currently use drugs after having used the following drugs: Marijuana. No history on file for alcohol use.  ROS: All other review of systems were reviewed and are negative except what is noted above in HPI  Physical Exam: BP 117/69    Pulse  69   Constitutional:  Alert and oriented, No acute distress. HEENT: [place] AT, Trachea midline Cardiovascular: No clubbing, cyanosis, or edema. Respiratory: Normal respiratory effort, no increased work of breathing. GI: Abdomen is soft, nontender, nondistended, no abdominal masses GU: No CVA tenderness.  Skin: No rashes, bruises or suspicious lesions. Neurologic: Grossly intact, no focal deficits, moving all 4 extremities. Psychiatric: Normal mood and affect.  Laboratory Data: Lab Results  Component Value Date   WBC 5.1 07/13/2021   HGB 14.6 07/13/2021   HCT 42.2 07/13/2021   MCV 99 (H) 07/13/2021   PLT 81 (LL) 07/13/2021    Lab Results  Component Value Date   CREATININE 0.83 08/04/2021    No results found for: PSA  No results found for: TESTOSTERONE  No results found for: HGBA1C  Urinalysis    Component Value Date/Time   APPEARANCEUR Clear 08/06/2021 1234   GLUCOSEU  3+ (A) 08/06/2021 1234   BILIRUBINUR Negative 08/06/2021 1234   PROTEINUR Negative 08/06/2021 1234   NITRITE Negative 08/06/2021 1234   LEUKOCYTESUR Negative 08/06/2021 1234    Lab Results  Component Value Date   LABMICR See below: 08/06/2021   WBCUA None seen 08/06/2021   LABEPIT 0-10 08/06/2021   MUCUS Present 08/06/2021   BACTERIA None seen 08/06/2021    Pertinent Imaging:  Results for orders placed in visit on 08/09/21  DG Abd 1 View  Narrative CLINICAL DATA:  Kidney stone  EXAM: ABDOMEN - 1 VIEW  COMPARISON:  Knee radiograph dated July 13, 2021  FINDINGS: Normal bowel gas pattern. Decreased size and number of renal stones when compared with prior exam. Largest stone of the right kidney measures 7 mm and is located in the lower pole. Largest stone of the left kidney measures 6 mm and is located in lower pole. Right pelvic calcification is less well seen on today's exam and is also likely decreased in size compared to prior exam.  IMPRESSION: Bilateral nephrolithiasis, overall  decreased in size and number when compared with prior exam.   Electronically Signed By: Yetta Glassman M.D. On: 08/10/2021 13:20  No results found for this or any previous visit.  No results found for this or any previous visit.  No results found for this or any previous visit.  Results for orders placed during the hospital encounter of 08/13/21  Ultrasound renal complete  Narrative CLINICAL DATA:  Nephrolithiasis.  EXAM: RENAL / URINARY TRACT ULTRASOUND COMPLETE  COMPARISON:  None.  FINDINGS: Right Kidney:  Renal measurements: 13.2 x 8.3 x 7.6 cm = volume: 433 mL. Echogenicity within normal limits. No mass or hydronephrosis visualized. Mild fullness of the central intrarenal collecting system.  Left Kidney:  Renal measurements: 14.8 x 7.9 x 6.7 cm = volume: 406 mL. Echogenicity within normal limits. No mass or hydronephrosis visualized. Suggestion of a 1 cm stone over the upper pole. Mild fullness of the central intrarenal collecting system.  Bladder:  Appears normal for degree of bladder distention.  Other:  None.  IMPRESSION: Normal size kidneys without hydronephrosis. There is mild fullness of the central intrarenal collecting systems bilaterally. Possible 1 cm stone over the upper pole left kidney.   Electronically Signed By: Marin Olp M.D. On: 08/14/2021 12:42  No results found for this or any previous visit.  No results found for this or any previous visit.  No results found for this or any previous visit.   Assessment & Plan:    Kidney stones - Plan: Urinalysis, Routine w reflex microscopic, Ultrasound renal complete, Urinalysis, Routine w reflex microscopic, BLADDER SCAN AMB NON-IMAGING  Pyuria - Plan: Urine culture Continue tamsulosin and await stone analysis for further tx. Macrobid Rx until cx resulted. Continue Urocit-K No follow-ups on file.  [place], Berneice Heinrich, PA-C  [facility] [place]

## 2021-10-01 ENCOUNTER — Ambulatory Visit: Payer: Medicare HMO | Admitting: Urology

## 2021-10-01 LAB — URINE CULTURE

## 2021-10-04 ENCOUNTER — Telehealth: Payer: Self-pay

## 2021-10-04 NOTE — Telephone Encounter (Signed)
Message left to return call.

## 2021-10-04 NOTE — Telephone Encounter (Signed)
Patient left a voicemail:  Needing to know if he needs to keep taking medication.  Please advise.  Call back:  [phone]  Thanks, Helene Kelp

## 2021-10-20 ENCOUNTER — Ambulatory Visit (HOSPITAL_COMMUNITY)
Admission: RE | Admit: 2021-10-20 | Discharge: 2021-10-20 | Disposition: A | Discharge status: Home / Self Care | Payer: Medicare HMO | Source: Ambulatory Visit | Attending: Physician Assistant | Admitting: Physician Assistant

## 2021-10-20 ENCOUNTER — Other Ambulatory Visit: Payer: Self-pay

## 2021-10-20 DIAGNOSIS — N200 Calculus of kidney: Secondary | ICD-10-CM | POA: Diagnosis present

## 2021-11-10 ENCOUNTER — Other Ambulatory Visit: Payer: Self-pay

## 2021-11-10 ENCOUNTER — Ambulatory Visit: Payer: Medicare HMO | Admitting: Urology

## 2021-11-10 VITALS — BP 116/66 | HR 74

## 2021-11-10 DIAGNOSIS — R82994 Hypercalciuria: Secondary | ICD-10-CM | POA: Diagnosis not present

## 2021-11-10 DIAGNOSIS — N200 Calculus of kidney: Secondary | ICD-10-CM

## 2021-11-10 LAB — URINALYSIS, ROUTINE W REFLEX MICROSCOPIC
Bilirubin, UA: NEGATIVE
Ketones, UA: NEGATIVE
Leukocytes,UA: NEGATIVE
Nitrite, UA: NEGATIVE
Protein,UA: NEGATIVE
Specific Gravity, UA: <1.005 — ABNORMAL LOW (ref 1.005–1.030)
Urobilinogen, Ur: 0.2 mg/dL (ref 0.2–1.0)
pH, UA: 6 (ref 5.0–7.5)

## 2021-11-10 LAB — MICROSCOPIC EXAMINATION
Bacteria, UA: NONE SEEN
Epithelial Cells (non renal): NONE SEEN /HPF (ref 0–10)
Renal Epithel, UA: NONE SEEN /HPF
WBC, UA: NONE SEEN /HPF (ref 0–5)

## 2021-11-10 MED ORDER — MAGNESIUM OXIDE 400 MG PO TABS: 400.0000 mg | ORAL_TABLET | Freq: Every day | ORAL | 11 refills | Status: AC

## 2021-11-10 MED ORDER — TAMSULOSIN HCL 0.4 MG PO CAPS: 0.4000 mg | ORAL_CAPSULE | Freq: Every day | ORAL | 11 refills | Status: DC

## 2021-11-10 MED ORDER — INDAPAMIDE 2.5 MG PO TABS: 2.5000 mg | ORAL_TABLET | Freq: Every day | ORAL | 11 refills | Status: DC

## 2021-11-10 MED ORDER — POTASSIUM CITRATE ER 10 MEQ (1080 MG) PO TBCR: 20.0000 meq | EXTENDED_RELEASE_TABLET | Freq: Two times a day (BID) | ORAL | 11 refills | Status: DC

## 2021-11-10 NOTE — Progress Notes (Signed)
11/10/2021 8:58 AM   Carolynn Serve [birth year] [id number]  Referring provider: Allie Dimmer, [place] [address]  Followup nephrolithiasis   HPI: [name] is a 51yo here for followup for nephrolithiasis. No stone events since last visit. Renal US from 10/20/2021 shows no calculi. 24 hour urine showed Calcium 256. Oxalate 54, citrate 675, Urine volume 4L. He denies any flank pain. No other complaints today   PMH: Past Medical History:  Diagnosis Date   Anxiety    Crohn disease ([place]) 2008   Diabetes mellitus without complication ([place])    Sleep apnea     Surgical History: Past Surgical History:  Procedure Laterality Date   COLECTOMY     x 7 for crohns   COLONOSCOPY     CYSTOSCOPY  2020   CYSTOSCOPY W/ URETERAL STENT REMOVAL  07/29/2021   Procedure: CYSTOSCOPY WITH RIGHT URETERAL STENT REMOVAL;  Surgeon: Cleon Gustin, MD;  Location: [facility];  Service: Urology;;   [place], URETEROSCOPY AND STENT PLACEMENT Right 07/15/2021   Procedure: [place], URETEROSCOPY AND STENT PLACEMENT;  Surgeon: Cleon Gustin, MD;  Location: [facility];  Service: Urology;  Laterality: Right;   CYSTOSCOPY WITH RETROGRADE PYELOGRAM, URETEROSCOPY AND STENT PLACEMENT Left 07/29/2021   Procedure: CYSTOSCOPY WITH LEFT RETROGRADE PYELOGRAM, LEFT URETEROSCOPY AND LEFT URETERAL STENT PLACEMENT;  Surgeon: Cleon Gustin, MD;  Location: [facility];  Service: Urology;  Laterality: Left;   CYSTOSCOPY/URETEROSCOPY/HOLMIUM LASER/STENT PLACEMENT Right 09/16/2021   Procedure: CYSTOSCOPY/URETEROSCOPY/STENT PLACEMENT;  Surgeon: Cleon Gustin, MD;  Location: [facility];  Service: Urology;  Laterality: Right;   HAND SURGERY Left    removal of cyst   HOLMIUM LASER APPLICATION Right [id number]   Procedure: HOLMIUM LASER APPLICATION;  Surgeon: Cleon Gustin, MD;  Location: [facility];  Service: Urology;  Laterality:  Right;   HOLMIUM LASER APPLICATION Left [id number]   Procedure: HOLMIUM LASER APPLICATION;  Surgeon: Cleon Gustin, MD;  Location: [facility];  Service: Urology;  Laterality: Left;   KNEE SURGERY Left    STONE EXTRACTION WITH BASKET Left 07/29/2021   Procedure: STONE EXTRACTION WITH BASKET;  Surgeon: Cleon Gustin, MD;  Location: [facility];  Service: Urology;  Laterality: Left;    Home Medications:  Allergies as of 11/10/2021       Reactions   Nsaids Other (See Comments)   Crohns Disease; takes Humira   Fish Oil Other (See Comments)   Other Reaction: BURNING IN THROAT, STOMACH - pt unaware of this reaction   Sulfamethoxazole-trimethoprim    nausea        Medication List        Accurate as of November 10, 2021  8:58 AM. If you have any questions, ask your nurse or doctor.          atenolol 50 MG tablet Commonly known as: TENORMIN Take 50 mg by mouth daily.   cetirizine 10 MG tablet Commonly known as: ZYRTEC Take 10 mg by mouth daily as needed for allergies.   citalopram 20 MG tablet Commonly known as: CELEXA Take 20 mg by mouth daily.   cyanocobalamin 1000 MCG/ML injection Commonly known as: (VITAMIN B-12) Inject 1,000 mcg into the muscle every 30 (thirty) days.   dicyclomine 10 MG capsule Commonly known as: BENTYL Take 20 mg by mouth 4 (four) times daily -  before meals and at bedtime.   gabapentin 100 MG capsule Commonly known as: NEURONTIN Take 100 mg by  mouth at bedtime.   glimepiride 4 MG tablet Commonly known as: AMARYL Take 4 mg by mouth 2 (two) times daily with a meal.   Humira Pen 40 MG/0.4ML Pnkt Generic drug: Adalimumab Inject 40 mg into the skin once a week.   Jardiance 25 MG Tabs tablet Generic drug: empagliflozin Take 25 mg by mouth every morning.   lovastatin 10 MG tablet Commonly known as: MEVACOR Take 10 mg by mouth at bedtime.   magnesium oxide 400 MG tablet Commonly known as: MAG-OX Take 400 mg by mouth daily.    ondansetron 4 MG disintegrating tablet Commonly known as: ZOFRAN-ODT Take 1 tablet (4 mg total) by mouth every 6 (six) hours as needed.   oxyCODONE-acetaminophen 5-325 MG tablet Commonly known as: Percocet Take 1 tablet by mouth every 4 (four) hours as needed for severe pain.   potassium citrate 10 MEQ (1080 MG) SR tablet Commonly known as: UROCIT-K Take 20 mEq by mouth in the morning and at bedtime.   sulfamethoxazole-trimethoprim 800-160 MG tablet Commonly known as: BACTRIM DS Take 1 tablet by mouth every 12 (twelve) hours.   tamsulosin 0.4 MG Caps capsule Commonly known as: FLOMAX Take 1 capsule (0.4 mg total) by mouth daily after supper.   TUMS ULTRA 1000 PO Take 1 tablet by mouth 3 (three) times daily as needed (heartburn).   UltiCare Tuberculin Safety Syr 25G X 5/8" 1 ML Misc Generic drug: TUBERCULIN SYR 1CC/25GX5/8"   Vitamin D (Ergocalciferol) 1.25 MG (50000 UNIT) Caps capsule Commonly known as: DRISDOL Take 50,000 Units by mouth every 7 (seven) days.        Allergies:  Allergies  Allergen Reactions   Nsaids Other (See Comments)    Crohns Disease; takes Humira   Fish Oil Other (See Comments)    Other Reaction: BURNING IN THROAT, STOMACH - pt unaware of this reaction   Sulfamethoxazole-Trimethoprim     nausea     Family History: Family History  Family history unknown: Yes    Social History:  reports that he has been smoking cigarettes. He has been smoking an average of .5 packs per day. He does not have any smokeless tobacco history on file. He reports that he does not currently use drugs after having used the following drugs: Marijuana. No history on file for alcohol use.  ROS: All other review of systems were reviewed and are negative except what is noted above in HPI  Physical Exam: BP 116/66    Pulse 74   Constitutional:  Alert and oriented, No acute distress. HEENT: [place] AT, moist mucus membranes.  Trachea midline, no masses. Cardiovascular: No  clubbing, cyanosis, or edema. Respiratory: Normal respiratory effort, no increased work of breathing. GI: Abdomen is soft, nontender, nondistended, no abdominal masses GU: No CVA tenderness.  Lymph: No cervical or inguinal lymphadenopathy. Skin: No rashes, bruises or suspicious lesions. Neurologic: Grossly intact, no focal deficits, moving all 4 extremities. Psychiatric: Normal mood and affect.  Laboratory Data: Lab Results  Component Value Date   WBC 5.1 07/13/2021   HGB 14.6 07/13/2021   HCT 42.2 07/13/2021   MCV 99 (H) 07/13/2021   PLT 81 (LL) 07/13/2021    Lab Results  Component Value Date   CREATININE 0.83 08/04/2021    No results found for: PSA  No results found for: TESTOSTERONE  No results found for: HGBA1C  Urinalysis    Component Value Date/Time   APPEARANCEUR Hazy (A) 09/29/2021 1538   GLUCOSEU 3+ (A) 09/29/2021 1538   BILIRUBINUR  Negative 09/29/2021 1538   PROTEINUR Negative 09/29/2021 1538   NITRITE Negative 09/29/2021 1538   LEUKOCYTESUR Trace (A) 09/29/2021 1538    Lab Results  Component Value Date   LABMICR See below: 09/29/2021   WBCUA >30 (A) 09/29/2021   LABEPIT 0-10 09/29/2021   MUCUS Present 08/06/2021   BACTERIA Few 09/29/2021    Pertinent Imaging: Renal US 10/20/2021: Images reviewed and discussed with the patient Results for orders placed in visit on 08/09/21  DG Abd 1 View  Narrative CLINICAL DATA:  Kidney stone  EXAM: ABDOMEN - 1 VIEW  COMPARISON:  Knee radiograph dated July 13, 2021  FINDINGS: Normal bowel gas pattern. Decreased size and number of renal stones when compared with prior exam. Largest stone of the right kidney measures 7 mm and is located in the lower pole. Largest stone of the left kidney measures 6 mm and is located in lower pole. Right pelvic calcification is less well seen on today's exam and is also likely decreased in size compared to prior exam.  IMPRESSION: Bilateral nephrolithiasis, overall  decreased in size and number when compared with prior exam.   Electronically Signed By: Yetta Glassman M.D. On: 08/10/2021 13:20  No results found for this or any previous visit.  No results found for this or any previous visit.  No results found for this or any previous visit.  Results for orders placed during the hospital encounter of 10/20/21  Ultrasound renal complete  Narrative CLINICAL DATA:  Kidney stone.  EXAM: RENAL / URINARY TRACT ULTRASOUND COMPLETE  COMPARISON:  Renal ultrasound dated 08/13/2021.  FINDINGS: Right Kidney:  Renal measurements: 13.8 x 8.2 x 7.4 cm = volume: 45 mL. Normal echogenicity. No hydronephrosis or shadowing stone.  Left Kidney:  Renal measurements: 13.6 x 7.1 x 7.2 cm = volume: 362 ML. Normal echogenicity. No hydronephrosis or shadowing stone.  Bladder:  Appears normal for degree of bladder distention. Bilateral ureteral jets noted.  Other:  There is diffuse increased liver echogenicity most commonly seen in the setting of fatty infiltration. Superimposed inflammation or fibrosis is not excluded. Clinical correlation is recommended.  IMPRESSION: 1. Unremarkable kidneys and urinary bladder. 2. Fatty liver.   Electronically Signed By: Anner Crete M.D. On: 10/21/2021 22:56  No results found for this or any previous visit.  No results found for this or any previous visit.  No results found for this or any previous visit.   Assessment & Plan:    1. Kidney stones -Continue UroctK 68meg BID -Indapamide 2.5mg  daily -RTC 3 months with renal US and repeat 24 hour urine - Urinalysis, Routine w reflex microscopic   No follow-ups on file.  Nicolette Bang, MD  [facility] [place]

## 2021-11-16 ENCOUNTER — Encounter: Payer: Self-pay | Admitting: Urology

## 2021-11-16 NOTE — Patient Instructions (Signed)
Dietary Guidelines to Help Prevent Kidney Stones Kidney stones are deposits of minerals and salts that form inside your kidneys. Your risk of developing kidney stones may be greater depending on your diet, your lifestyle, the medicines you take, and whether you have certain medical conditions. Most people can lower their chances of developing kidney stones by following the instructions below. Your dietitian may give you more specific instructions depending on your overall health and the type of kidney stones you tend to develop. What are tips for following this plan? Reading food labels  Choose foods with "no salt added" or "low-salt" labels. Limit your salt (sodium) intake to less than 1,500 mg a day. Choose foods with calcium for each meal and snack. Try to eat about 300 mg of calcium at each meal. Foods that contain 200-500 mg of calcium a serving include: 8 oz (237 mL) of milk, calcium-fortifiednon-dairy milk, and calcium-fortifiedfruit juice. Calcium-fortified means that calcium has been added to these drinks. 8 oz (237 mL) of kefir, yogurt, and soy yogurt. 4 oz (114 g) of tofu. 1 oz (28 g) of cheese. 1 cup (150 g) of dried figs. 1 cup (91 g) of cooked broccoli. One 3 oz (85 g) can of sardines or mackerel. Most people need 1,000-1,500 mg of calcium a day. Talk to your dietitian about how much calcium is recommended for you. Shopping Buy plenty of fresh fruits and vegetables. Most people do not need to avoid fruits and vegetables, even if these foods contain nutrients that may contribute to kidney stones. When shopping for convenience foods, choose: Whole pieces of fruit. Pre-made salads with dressing on the side. Low-fat fruit and yogurt smoothies. Avoid buying frozen meals or prepared deli foods. These can be high in sodium. Look for foods with live cultures, such as yogurt and kefir. Choose high-fiber grains, such as whole-wheat breads, oat bran, and wheat cereals. Cooking Do not add  salt to food when cooking. Place a salt shaker on the table and allow each person to add his or her own salt to taste. Use vegetable protein, such as beans, textured vegetable protein (TVP), or tofu, instead of meat in pasta, casseroles, and soups. Meal planning Eat less salt, if told by your dietitian. To do this: Avoid eating processed or pre-made food. Avoid eating fast food. Eat less animal protein, including cheese, meat, poultry, or fish, if told by your dietitian. To do this: Limit the number of times you have meat, poultry, fish, or cheese each week. Eat a diet free of meat at least 2 days a week. Eat only one serving each day of meat, poultry, fish, or seafood. When you prepare animal protein, cut pieces into small portion sizes. For most meat and fish, one serving is about the size of the palm of your hand. Eat at least five servings of fresh fruits and vegetables each day. To do this: Keep fruits and vegetables on hand for snacks. Eat one piece of fruit or a handful of berries with breakfast. Have a salad and fruit at lunch. Have two kinds of vegetables at dinner. Limit foods that are high in a substance called oxalate. These include: Spinach (cooked), rhubarb, beets, sweet potatoes, and Swiss chard. Peanuts. Potato chips, french fries, and baked potatoes with skin on. Nuts and nut products. Chocolate. If you regularly take a diuretic medicine, make sure to eat at least 1 or 2 servings of fruits or vegetables that are high in potassium each day. These include: Avocado. Banana. Orange, prune,   carrot, or tomato juice. Baked potato. Cabbage. Beans and split peas. Lifestyle  Drink enough fluid to keep your urine pale yellow. This is the most important thing you can do. Spread your fluid intake throughout the day. If you drink alcohol: Limit how much you use to: 0-1 drink a day for women who are not pregnant. 0-2 drinks a day for men. Be aware of how much alcohol is in your  drink. In the [place] one drink equals one 12 oz bottle of beer (355 mL), one 5 oz glass of wine (148 mL), or one 1 oz glass of hard liquor (44 mL). Lose weight if told by your health care provider. Work with your dietitian to find an eating plan and weight loss strategies that work best for you. General information Talk to your health care provider and dietitian about taking daily supplements. You may be told the following depending on your health and the cause of your kidney stones: Not to take supplements with vitamin C. To take a calcium supplement. To take a daily probiotic supplement. To take other supplements such as magnesium, fish oil, or vitamin B6. Take over-the-counter and prescription medicines only as told by your health care provider. These include supplements. What foods should I limit? Limit your intake of the following foods, or eat them as told by your dietitian. Vegetables Spinach. Rhubarb. Beets. Canned vegetables. Pickles. Olives. Baked potatoes with skin. Grains Wheat bran. Baked goods. Salted crackers. Cereals high in sugar. Meats and other proteins Nuts. Nut butters. Large portions of meat, poultry, or fish. Salted, precooked, or cured meats, such as sausages, meat loaves, and hot dogs. Dairy Cheese. Beverages Regular soft drinks. Regular vegetable juice. Seasonings and condiments Seasoning blends with salt. Salad dressings. Soy sauce. Ketchup. Barbecue sauce. Other foods Canned soups. Canned pasta sauce. Casseroles. Pizza. Lasagna. Frozen meals. Potato chips. French fries. The items listed above may not be a complete list of foods and beverages you should limit. Contact a dietitian for more information. What foods should I avoid? Talk to your dietitian about specific foods you should avoid based on the type of kidney stones you have and your overall health. Fruits Grapefruit. The item listed above may not be a complete list of foods and beverages you should  avoid. Contact a dietitian for more information. Summary Kidney stones are deposits of minerals and salts that form inside your kidneys. You can lower your risk of kidney stones by making changes to your diet. The most important thing you can do is drink enough fluid. Drink enough fluid to keep your urine pale yellow. Talk to your dietitian about how much calcium you should have each day, and eat less salt and animal protein as told by your dietitian. This information is not intended to replace advice given to you by your health care provider. Make sure you discuss any questions you have with your health care provider. Document Revised: 09/05/2019 Document Reviewed: 09/05/2019 Elsevier Patient Education  2022 [employer]  

## 2021-12-22 ENCOUNTER — Encounter: Payer: Self-pay | Admitting: Urology

## 2021-12-22 ENCOUNTER — Ambulatory Visit: Payer: Medicare HMO | Admitting: Urology

## 2021-12-22 VITALS — BP 113/57 | HR 66

## 2021-12-22 DIAGNOSIS — N200 Calculus of kidney: Secondary | ICD-10-CM

## 2021-12-22 DIAGNOSIS — R350 Frequency of micturition: Secondary | ICD-10-CM | POA: Diagnosis not present

## 2021-12-22 LAB — BLADDER SCAN AMB NON-IMAGING: Scan Result: 202

## 2021-12-22 MED ORDER — TAMSULOSIN HCL 0.4 MG PO CAPS: 0.4000 mg | ORAL_CAPSULE | Freq: Two times a day (BID) | ORAL | 11 refills | Status: DC

## 2021-12-22 NOTE — Progress Notes (Signed)
? ?12/22/2021 ?3:06 PM  ? ?Thomas Alexander ?1970/11/28 ?[id number] ? ?Referring provider: Allie Dimmer, MD ?[place] ?[place] [address] ? ?Urinary frequency ? ? ?HPI: ?[name] is a 51yo here for evaluation of urinary frequency. IPSS 22 QOL 6 on flomax. He has been on jardiance since October 2022. He has a feeling of incomplete emptying. PVR 202cc. Urine stream is strong. Nocturia 4x. His blood sugars have been in the 200s. No dysuria or hematuria. UA today shows 3+ glucose.  ? ? ?PMH: ?Past Medical History:  ?Diagnosis Date  ? Anxiety   ? Crohn disease ([place]) 2008  ? Diabetes mellitus without complication ([place])   ? Sleep apnea   ? ? ?Surgical History: ?Past Surgical History:  ?Procedure Laterality Date  ? COLECTOMY    ? x 7 for crohns  ? COLONOSCOPY    ? CYSTOSCOPY  2020  ? CYSTOSCOPY W/ URETERAL STENT REMOVAL  07/29/2021  ? Procedure: CYSTOSCOPY WITH RIGHT URETERAL STENT REMOVAL;  Surgeon: Cleon Gustin, MD;  Location: [facility];  Service: Urology;;  ? [place], URETEROSCOPY AND STENT PLACEMENT Right 07/15/2021  ? Procedure: CYSTOSCOPY WITH RETROGRADE PYELOGRAM, URETEROSCOPY AND STENT PLACEMENT;  Surgeon: Cleon Gustin, MD;  Location: [facility];  Service: Urology;  Laterality: Right;  ? CYSTOSCOPY WITH RETROGRADE PYELOGRAM, URETEROSCOPY AND STENT PLACEMENT Left 07/29/2021  ? Procedure: CYSTOSCOPY WITH LEFT RETROGRADE PYELOGRAM, LEFT URETEROSCOPY AND LEFT URETERAL STENT PLACEMENT;  Surgeon: Cleon Gustin, MD;  Location: [facility];  Service: Urology;  Laterality: Left;  ? CYSTOSCOPY/URETEROSCOPY/HOLMIUM LASER/STENT PLACEMENT Right 09/16/2021  ? Procedure: CYSTOSCOPY/URETEROSCOPY/STENT PLACEMENT;  Surgeon: Cleon Gustin, MD;  Location: [facility];  Service: Urology;  Laterality: Right;  ? HAND SURGERY Left   ? removal of cyst  ? HOLMIUM LASER APPLICATION Right [id number]  ? Procedure: HOLMIUM LASER APPLICATION;  Surgeon: Cleon Gustin, MD;  Location:  [facility];  Service: Urology;  Laterality: Right;  ? HOLMIUM LASER APPLICATION Left [id number]  ? Procedure: HOLMIUM LASER APPLICATION;  Surgeon: Cleon Gustin, MD;  Location: [facility];  Service: Urology;  Laterality: Left;  ? KNEE SURGERY Left   ? STONE EXTRACTION WITH BASKET Left 07/29/2021  ? Procedure: STONE EXTRACTION WITH BASKET;  Surgeon: Cleon Gustin, MD;  Location: [facility];  Service: Urology;  Laterality: Left;  ? ? ?Home Medications:  ?Allergies as of 12/22/2021   ? ?   Reactions  ? Nsaids Other (See Comments)  ? Crohns Disease; takes Humira  ? Fish Oil Other (See Comments)  ? Other Reaction: BURNING IN THROAT, STOMACH - pt unaware of this reaction  ? Sulfamethoxazole-trimethoprim   ? nausea  ? ?  ? ?  ?Medication List  ?  ? ?  ? Accurate as of December 22, 2021  3:06 PM. If you have any questions, ask your nurse or doctor.  ?  ?  ? ?  ? ?atenolol 50 MG tablet ?Commonly known as: TENORMIN ?Take 50 mg by mouth daily. ?  ?cetirizine 10 MG tablet ?Commonly known as: ZYRTEC ?Take 10 mg by mouth daily as needed for allergies. ?  ?citalopram 20 MG tablet ?Commonly known as: CELEXA ?Take 20 mg by mouth daily. ?  ?cyanocobalamin 1000 MCG/ML injection ?Commonly known as: (VITAMIN B-12) ?Inject 1,000 mcg into the muscle every 30 (thirty) days. ?  ?dicyclomine 10 MG capsule ?Commonly known as: BENTYL ?Take 20 mg by mouth 4 (four) times daily -  before meals and at bedtime. ?  ?  gabapentin 100 MG capsule ?Commonly known as: NEURONTIN ?Take 100 mg by mouth at bedtime. ?  ?glimepiride 4 MG tablet ?Commonly known as: AMARYL ?Take 4 mg by mouth 2 (two) times daily with a meal. ?  ?Humira Pen 40 MG/0.4ML Pnkt ?Generic drug: Adalimumab ?Inject 40 mg into the skin once a week. ?  ?indapamide 2.5 MG tablet ?Commonly known as: LOZOL ?Take 1 tablet (2.5 mg total) by mouth daily. ?  ?Jardiance 25 MG Tabs tablet ?Generic drug: empagliflozin ?Take 25 mg by mouth every morning. ?  ?lovastatin 10 MG tablet ?Commonly known as:  MEVACOR ?Take 10 mg by mouth at bedtime. ?  ?magnesium oxide 400 MG tablet ?Commonly known as: MAG-OX ?Take 1 tablet (400 mg total) by mouth daily. ?  ?ondansetron 4 MG disintegrating tablet ?Commonly known as: ZOFRAN-ODT ?Take 1 tablet (4 mg total) by mouth every 6 (six) hours as needed. ?  ?oxyCODONE-acetaminophen 5-325 MG tablet ?Commonly known as: Percocet ?Take 1 tablet by mouth every 4 (four) hours as needed for severe pain. ?  ?potassium citrate 10 MEQ (1080 MG) SR tablet ?Commonly known as: UROCIT-K ?Take 2 tablets (20 mEq total) by mouth in the morning and at bedtime. ?  ?sulfamethoxazole-trimethoprim 800-160 MG tablet ?Commonly known as: BACTRIM DS ?Take 1 tablet by mouth every 12 (twelve) hours. ?  ?tamsulosin 0.4 MG Caps capsule ?Commonly known as: FLOMAX ?Take 1 capsule (0.4 mg total) by mouth daily after supper. ?  ?TUMS ULTRA 1000 PO ?Take 1 tablet by mouth 3 (three) times daily as needed (heartburn). ?  ?UltiCare Tuberculin Safety Syr 25G X 5/8" 1 ML Misc ?Generic drug: TUBERCULIN SYR 1CC/25GX5/8" ?  ?Vitamin D (Ergocalciferol) 1.25 MG (50000 UNIT) Caps capsule ?Commonly known as: DRISDOL ?Take 50,000 Units by mouth every 7 (seven) days. ?  ? ?  ? ? ?Allergies:  ?Allergies  ?Allergen Reactions  ? Nsaids Other (See Comments)  ?  Crohns Disease; takes Humira  ? Fish Oil Other (See Comments)  ?  Other Reaction: BURNING IN THROAT, STOMACH - pt unaware of this reaction  ? Sulfamethoxazole-Trimethoprim   ?  nausea ?  ? ? ?Family History: ?Family History  ?Family history unknown: Yes  ? ? ?Social History:  reports that he has been smoking cigarettes. He has been smoking an average of .5 packs per day. He does not have any smokeless tobacco history on file. He reports that he does not currently use drugs after having used the following drugs: Marijuana. No history on file for alcohol use. ? ?ROS: ?All other review of systems were reviewed and are negative except what is noted above in HPI ? ?Physical  Exam: ?BP (!) 113/57   Pulse 66   ?Constitutional:  Alert and oriented, No acute distress. ?HEENT: [place] AT, moist mucus membranes.  Trachea midline, no masses. ?Cardiovascular: No clubbing, cyanosis, or edema. ?Respiratory: Normal respiratory effort, no increased work of breathing. ?GI: Abdomen is soft, nontender, nondistended, no abdominal masses ?GU: No CVA tenderness.  ?Lymph: No cervical or inguinal lymphadenopathy. ?Skin: No rashes, bruises or suspicious lesions. ?Neurologic: Grossly intact, no focal deficits, moving all 4 extremities. ?Psychiatric: Normal mood and affect. ? ?Laboratory Data: ?Lab Results  ?Component Value Date  ? WBC 5.1 07/13/2021  ? HGB 14.6 07/13/2021  ? HCT 42.2 07/13/2021  ? MCV 99 (H) 07/13/2021  ? PLT 81 (LL) 07/13/2021  ? ? ?Lab Results  ?Component Value Date  ? CREATININE 0.83 08/04/2021  ? ? ?No results found for: PSA ? ?  No results found for: TESTOSTERONE ? ?No results found for: HGBA1C ? ?Urinalysis ?   ?Component Value Date/Time  ? APPEARANCEUR Clear 11/10/2021 0958  ? GLUCOSEU 3+ (A) 11/10/2021 0958  ? BILIRUBINUR Negative 11/10/2021 0958  ? PROTEINUR Negative 11/10/2021 0958  ? NITRITE Negative 11/10/2021 0958  ? LEUKOCYTESUR Negative 11/10/2021 0958  ? ? ?Lab Results  ?Component Value Date  ? LABMICR See below: 11/10/2021  ? [place] None seen 11/10/2021  ? LABEPIT None seen 11/10/2021  ? MUCUS Present 11/10/2021  ? BACTERIA None seen 11/10/2021  ? ? ?Pertinent Imaging: ? ?Results for orders placed in visit on 08/09/21 ? ?DG Abd 1 View ? ?Narrative ?CLINICAL DATA:  Kidney stone ? ?EXAM: ?ABDOMEN - 1 VIEW ? ?COMPARISON:  Knee radiograph dated July 13, 2021 ? ?FINDINGS: ?Normal bowel gas pattern. Decreased size and number of renal stones ?when compared with prior exam. Largest stone of the right kidney ?measures 7 mm and is located in the lower pole. Largest stone of the ?left kidney measures 6 mm and is located in lower pole. Right pelvic ?calcification is less well seen on  today's exam and is also likely ?decreased in size compared to prior exam. ? ?IMPRESSION: ?Bilateral nephrolithiasis, overall decreased in size and number when ?compared with prior exam. ? ? ?Electronically Signed ?By:

## 2021-12-22 NOTE — Patient Instructions (Signed)

## 2021-12-22 NOTE — Progress Notes (Signed)
post void residual=202 

## 2021-12-23 LAB — MICROSCOPIC EXAMINATION
Bacteria, UA: NONE SEEN
RBC, Urine: NONE SEEN /hpf (ref 0–2)

## 2021-12-23 LAB — URINALYSIS, ROUTINE W REFLEX MICROSCOPIC
Bilirubin, UA: NEGATIVE
Ketones, UA: NEGATIVE
Leukocytes,UA: NEGATIVE
Nitrite, UA: NEGATIVE
Protein,UA: NEGATIVE
RBC, UA: NEGATIVE
Specific Gravity, UA: 1.01 (ref 1.005–1.030)
Urobilinogen, Ur: 0.2 mg/dL (ref 0.2–1.0)
pH, UA: 7 (ref 5.0–7.5)

## 2022-02-01 ENCOUNTER — Ambulatory Visit: Payer: Medicare HMO | Admitting: Urology

## 2022-02-01 DIAGNOSIS — N200 Calculus of kidney: Secondary | ICD-10-CM

## 2022-02-01 DIAGNOSIS — R350 Frequency of micturition: Secondary | ICD-10-CM

## 2022-02-09 ENCOUNTER — Ambulatory Visit (HOSPITAL_COMMUNITY)
Admission: RE | Admit: 2022-02-09 | Discharge: 2022-02-09 | Disposition: A | Discharge status: Home / Self Care | Payer: Medicare HMO | Source: Ambulatory Visit | Attending: Urology | Admitting: Urology

## 2022-02-09 DIAGNOSIS — N200 Calculus of kidney: Secondary | ICD-10-CM | POA: Diagnosis not present

## 2022-02-11 ENCOUNTER — Encounter: Payer: Self-pay | Admitting: Urology

## 2022-02-11 ENCOUNTER — Ambulatory Visit: Payer: Medicare HMO | Admitting: Urology

## 2022-02-11 VITALS — BP 104/54 | HR 71 | Ht 72.0 in | Wt 210.0 lb

## 2022-02-11 DIAGNOSIS — Z87442 Personal history of urinary calculi: Secondary | ICD-10-CM | POA: Diagnosis not present

## 2022-02-11 DIAGNOSIS — R350 Frequency of micturition: Secondary | ICD-10-CM | POA: Diagnosis not present

## 2022-02-11 DIAGNOSIS — N200 Calculus of kidney: Secondary | ICD-10-CM

## 2022-02-11 LAB — URINALYSIS, ROUTINE W REFLEX MICROSCOPIC
Bilirubin, UA: NEGATIVE
Ketones, UA: NEGATIVE
Nitrite, UA: NEGATIVE
Protein,UA: NEGATIVE
Specific Gravity, UA: 1.015 (ref 1.005–1.030)
Urobilinogen, Ur: 0.2 mg/dL (ref 0.2–1.0)
pH, UA: 7 (ref 5.0–7.5)

## 2022-02-11 LAB — MICROSCOPIC EXAMINATION
Epithelial Cells (non renal): NONE SEEN /hpf (ref 0–10)
Renal Epithel, UA: NONE SEEN /hpf

## 2022-02-11 MED ORDER — INDAPAMIDE 2.5 MG PO TABS: 2.5000 mg | ORAL_TABLET | Freq: Every day | ORAL | 11 refills | Status: DC

## 2022-02-11 MED ORDER — NITROFURANTOIN MONOHYD MACRO 100 MG PO CAPS: 100.0000 mg | ORAL_CAPSULE | Freq: Two times a day (BID) | ORAL | 0 refills | Status: AC

## 2022-02-11 MED ORDER — POTASSIUM CITRATE ER 10 MEQ (1080 MG) PO TBCR: 20.0000 meq | EXTENDED_RELEASE_TABLET | Freq: Two times a day (BID) | ORAL | 11 refills | Status: DC

## 2022-02-11 NOTE — Progress Notes (Signed)
02/11/2022 10:50 AM   Carolynn Serve 08-04-1971 [id number]  Referring provider: Allie Dimmer, [place] [address]  Followup nephrolithiasis   HPI: Mr Virola is a 51yo here for followup for nephrolithiasis. Renal US shows no calculi. He is currently on indapamide 2.5mg  daily and Urocit K 47meq BID. No flank pain. No significant LUTS. He drinks 60-80oz of water daily. No other complaints   PMH: Past Medical History:  Diagnosis Date   Anxiety    Crohn disease ([place]) 2008   Diabetes mellitus without complication ([place])    Sleep apnea     Surgical History: Past Surgical History:  Procedure Laterality Date   COLECTOMY     x 7 for crohns   COLONOSCOPY     CYSTOSCOPY  2020   CYSTOSCOPY W/ URETERAL STENT REMOVAL  07/29/2021   Procedure: CYSTOSCOPY WITH RIGHT URETERAL STENT REMOVAL;  Surgeon: Cleon Gustin, MD;  Location: [facility];  Service: Urology;;   [place], URETEROSCOPY AND STENT PLACEMENT Right 07/15/2021   Procedure: [place], URETEROSCOPY AND STENT PLACEMENT;  Surgeon: Cleon Gustin, MD;  Location: [facility];  Service: Urology;  Laterality: Right;   CYSTOSCOPY WITH RETROGRADE PYELOGRAM, URETEROSCOPY AND STENT PLACEMENT Left 07/29/2021   Procedure: CYSTOSCOPY WITH LEFT RETROGRADE PYELOGRAM, LEFT URETEROSCOPY AND LEFT URETERAL STENT PLACEMENT;  Surgeon: Cleon Gustin, MD;  Location: [facility];  Service: Urology;  Laterality: Left;   CYSTOSCOPY/URETEROSCOPY/HOLMIUM LASER/STENT PLACEMENT Right 09/16/2021   Procedure: CYSTOSCOPY/URETEROSCOPY/STENT PLACEMENT;  Surgeon: Cleon Gustin, MD;  Location: [facility];  Service: Urology;  Laterality: Right;   HAND SURGERY Left    removal of cyst   HOLMIUM LASER APPLICATION Right [id number]   Procedure: HOLMIUM LASER APPLICATION;  Surgeon: Cleon Gustin, MD;  Location: [facility];  Service: Urology;  Laterality: Right;   HOLMIUM LASER  APPLICATION Left [id number]   Procedure: HOLMIUM LASER APPLICATION;  Surgeon: Cleon Gustin, MD;  Location: [facility];  Service: Urology;  Laterality: Left;   KNEE SURGERY Left    STONE EXTRACTION WITH BASKET Left 07/29/2021   Procedure: STONE EXTRACTION WITH BASKET;  Surgeon: Cleon Gustin, MD;  Location: [facility];  Service: Urology;  Laterality: Left;    Home Medications:  Allergies as of 02/11/2022       Reactions   Nsaids Other (See Comments)   Crohns Disease; takes Humira   Fish Oil Other (See Comments)   Other Reaction: BURNING IN THROAT, STOMACH - pt unaware of this reaction   Sulfamethoxazole-trimethoprim    nausea        Medication List        Accurate as of Feb 11, 2022 10:50 AM. If you have any questions, ask your nurse or doctor.          atenolol 50 MG tablet Commonly known as: TENORMIN Take 50 mg by mouth daily.   cetirizine 10 MG tablet Commonly known as: ZYRTEC Take 10 mg by mouth daily as needed for allergies.   citalopram 20 MG tablet Commonly known as: CELEXA Take 20 mg by mouth daily.   cyanocobalamin 1000 MCG/ML injection Commonly known as: (VITAMIN B-12) Inject 1,000 mcg into the muscle every 30 (thirty) days.   dicyclomine 10 MG capsule Commonly known as: BENTYL Take 20 mg by mouth 4 (four) times daily -  before meals and at bedtime.   gabapentin 100 MG capsule Commonly known as: NEURONTIN Take 100 mg by mouth at bedtime.  glimepiride 4 MG tablet Commonly known as: AMARYL Take 4 mg by mouth 2 (two) times daily with a meal.   Humira Pen 40 MG/0.4ML Pnkt Generic drug: Adalimumab Inject 40 mg into the skin once a week.   indapamide 2.5 MG tablet Commonly known as: LOZOL Take 1 tablet (2.5 mg total) by mouth daily.   Jardiance 25 MG Tabs tablet Generic drug: empagliflozin Take 25 mg by mouth every morning.   lovastatin 10 MG tablet Commonly known as: MEVACOR Take 10 mg by mouth at bedtime.   magnesium oxide 400 MG  tablet Commonly known as: MAG-OX Take 1 tablet (400 mg total) by mouth daily.   ondansetron 4 MG disintegrating tablet Commonly known as: ZOFRAN-ODT Take 1 tablet (4 mg total) by mouth every 6 (six) hours as needed.   oxyCODONE-acetaminophen 5-325 MG tablet Commonly known as: Percocet Take 1 tablet by mouth every 4 (four) hours as needed for severe pain.   potassium citrate 10 MEQ (1080 MG) SR tablet Commonly known as: UROCIT-K Take 2 tablets (20 mEq total) by mouth in the morning and at bedtime.   sulfamethoxazole-trimethoprim 800-160 MG tablet Commonly known as: BACTRIM DS Take 1 tablet by mouth every 12 (twelve) hours.   tamsulosin 0.4 MG Caps capsule Commonly known as: FLOMAX Take 1 capsule (0.4 mg total) by mouth 2 (two) times daily.   TUMS ULTRA 1000 PO Take 1 tablet by mouth 3 (three) times daily as needed (heartburn).   UltiCare Tuberculin Safety Syr 25G X 5/8" 1 ML Misc Generic drug: TUBERCULIN SYR 1CC/25GX5/8"   Vitamin D (Ergocalciferol) 1.25 MG (50000 UNIT) Caps capsule Commonly known as: DRISDOL Take 50,000 Units by mouth every 7 (seven) days.        Allergies:  Allergies  Allergen Reactions   Nsaids Other (See Comments)    Crohns Disease; takes Humira   Fish Oil Other (See Comments)    Other Reaction: BURNING IN THROAT, STOMACH - pt unaware of this reaction   Sulfamethoxazole-Trimethoprim     nausea     Family History: Family History  Family history unknown: Yes    Social History:  reports that he has been smoking cigarettes. He has been smoking an average of .5 packs per day. He does not have any smokeless tobacco history on file. He reports that he does not currently use drugs after having used the following drugs: Marijuana. No history on file for alcohol use.  ROS: All other review of systems were reviewed and are negative except what is noted above in HPI  Physical Exam: BP (!) 104/54   Pulse 71   Ht 6' (1.829 m)   Wt 210 lb (95.3 kg)    BMI 28.48 kg/m   Constitutional:  Alert and oriented, No acute distress. HEENT: [place] AT, moist mucus membranes.  Trachea midline, no masses. Cardiovascular: No clubbing, cyanosis, or edema. Respiratory: Normal respiratory effort, no increased work of breathing. GI: Abdomen is soft, nontender, nondistended, no abdominal masses GU: No CVA tenderness.  Lymph: No cervical or inguinal lymphadenopathy. Skin: No rashes, bruises or suspicious lesions. Neurologic: Grossly intact, no focal deficits, moving all 4 extremities. Psychiatric: Normal mood and affect.  Laboratory Data: Lab Results  Component Value Date   WBC 5.1 07/13/2021   HGB 14.6 07/13/2021   HCT 42.2 07/13/2021   MCV 99 (H) 07/13/2021   PLT 81 (LL) 07/13/2021    Lab Results  Component Value Date   CREATININE 0.83 08/04/2021    No results found  for: PSA  No results found for: TESTOSTERONE  No results found for: HGBA1C  Urinalysis    Component Value Date/Time   APPEARANCEUR Clear 12/22/2021 1501   GLUCOSEU 3+ (A) 12/22/2021 1501   BILIRUBINUR Negative 12/22/2021 1501   PROTEINUR Negative 12/22/2021 1501   NITRITE Negative 12/22/2021 1501   LEUKOCYTESUR Negative 12/22/2021 1501    Lab Results  Component Value Date   LABMICR See below: 12/22/2021   WBCUA 0-5 12/22/2021   LABEPIT 0-10 12/22/2021   MUCUS Present 11/10/2021   BACTERIA None seen 12/22/2021    Pertinent Imaging: Renal 02/09/2022: Images reviewed and discussed with the patient  Results for orders placed in visit on 08/09/21  DG Abd 1 View  Narrative CLINICAL DATA:  Kidney stone  EXAM: ABDOMEN - 1 VIEW  COMPARISON:  Knee radiograph dated July 13, 2021  FINDINGS: Normal bowel gas pattern. Decreased size and number of renal stones when compared with prior exam. Largest stone of the right kidney measures 7 mm and is located in the lower pole. Largest stone of the left kidney measures 6 mm and is located in lower pole. Right  pelvic calcification is less well seen on today's exam and is also likely decreased in size compared to prior exam.  IMPRESSION: Bilateral nephrolithiasis, overall decreased in size and number when compared with prior exam.   Electronically Signed By: Yetta Glassman M.D. On: 08/10/2021 13:20  No results found for this or any previous visit.  No results found for this or any previous visit.  No results found for this or any previous visit.  Results for orders placed during the hospital encounter of 02/09/22  Ultrasound renal complete  Narrative CLINICAL DATA:  Kidney stone follow-up.  EXAM: RENAL / URINARY TRACT ULTRASOUND COMPLETE  COMPARISON:  September 30, 2021 ultrasound.  FINDINGS: Right Kidney:  Renal measurements: 12.6 x 8.1 x 7.8 cm = volume: 413 mL. Echogenicity within normal limits. No mass or hydronephrosis visualized.  Left Kidney:  Renal measurements: 13.2 x 6.7 x 6.5 cm = volume: 302 mL. Echogenicity within normal limits. No mass or hydronephrosis visualized.  Bladder:  Appears normal for degree of bladder distention.  Other:  None.  IMPRESSION: 1. No abnormalities are identified.  No stones are noted.   Electronically Signed By: Dorise Bullion III M.D. On: 02/10/2022 13:46  No results found for this or any previous visit.  No results found for this or any previous visit.  No results found for this or any previous visit.   Assessment & Plan:    1. Kidney stones -RTC 6 months with renal US - Urinalysis, Routine w reflex microscopic - indapamide (LOZOL) 2.5 MG tablet; Take 1 tablet (2.5 mg total) by mouth daily.  Dispense: 30 tablet; Refill: 11 - potassium citrate (UROCIT-K) 10 MEQ (1080 MG) SR tablet; Take 2 tablets (20 mEq total) by mouth in the morning and at bedtime.  Dispense: 120 tablet; Refill: 11  2. Urinary frequency -frequency improved after patient placed on insulin  No follow-ups on file.  Nicolette Bang,  MD  [facility] [place]

## 2022-02-11 NOTE — Patient Instructions (Signed)
Dietary Guidelines to Help Prevent Kidney Stones Kidney stones are deposits of minerals and salts that form inside your kidneys. Your risk of developing kidney stones may be greater depending on your diet, your lifestyle, the medicines you take, and whether you have certain medical conditions. Most people can lower their chances of developing kidney stones by following the instructions below. Your dietitian may give you more specific instructions depending on your overall health and the type of kidney stones you tend to develop. What are tips for following this plan? Reading food labels  Choose foods with "no salt added" or "low-salt" labels. Limit your salt (sodium) intake to less than 1,500 mg a day. Choose foods with calcium for each meal and snack. Try to eat about 300 mg of calcium at each meal. Foods that contain 200-500 mg of calcium a serving include: 8 oz (237 mL) of milk, calcium-fortifiednon-dairy milk, and calcium-fortifiedfruit juice. Calcium-fortified means that calcium has been added to these drinks. 8 oz (237 mL) of kefir, yogurt, and soy yogurt. 4 oz (114 g) of tofu. 1 oz (28 g) of cheese. 1 cup (150 g) of dried figs. 1 cup (91 g) of cooked broccoli. One 3 oz (85 g) can of sardines or mackerel. Most people need 1,000-1,500 mg of calcium a day. Talk to your dietitian about how much calcium is recommended for you. Shopping Buy plenty of fresh fruits and vegetables. Most people do not need to avoid fruits and vegetables, even if these foods contain nutrients that may contribute to kidney stones. When shopping for convenience foods, choose: Whole pieces of fruit. Pre-made salads with dressing on the side. Low-fat fruit and yogurt smoothies. Avoid buying frozen meals or prepared deli foods. These can be high in sodium. Look for foods with live cultures, such as yogurt and kefir. Choose high-fiber grains, such as whole-wheat breads, oat bran, and wheat cereals. Cooking Do not add  salt to food when cooking. Place a salt shaker on the table and allow each person to add his or her own salt to taste. Use vegetable protein, such as beans, textured vegetable protein (TVP), or tofu, instead of meat in pasta, casseroles, and soups. Meal planning Eat less salt, if told by your dietitian. To do this: Avoid eating processed or pre-made food. Avoid eating fast food. Eat less animal protein, including cheese, meat, poultry, or fish, if told by your dietitian. To do this: Limit the number of times you have meat, poultry, fish, or cheese each week. Eat a diet free of meat at least 2 days a week. Eat only one serving each day of meat, poultry, fish, or seafood. When you prepare animal protein, cut pieces into small portion sizes. For most meat and fish, one serving is about the size of the palm of your hand. Eat at least five servings of fresh fruits and vegetables each day. To do this: Keep fruits and vegetables on hand for snacks. Eat one piece of fruit or a handful of berries with breakfast. Have a salad and fruit at lunch. Have two kinds of vegetables at dinner. Limit foods that are high in a substance called oxalate. These include: Spinach (cooked), rhubarb, beets, sweet potatoes, and Swiss chard. Peanuts. Potato chips, french fries, and baked potatoes with skin on. Nuts and nut products. Chocolate. If you regularly take a diuretic medicine, make sure to eat at least 1 or 2 servings of fruits or vegetables that are high in potassium each day. These include: Avocado. Banana. Orange, prune,   carrot, or tomato juice. Baked potato. Cabbage. Beans and split peas. Lifestyle  Drink enough fluid to keep your urine pale yellow. This is the most important thing you can do. Spread your fluid intake throughout the day. If you drink alcohol: Limit how much you use to: 0-1 drink a day for women who are not pregnant. 0-2 drinks a day for men. Be aware of how much alcohol is in your  drink. In the [place] one drink equals one 12 oz bottle of beer (355 mL), one 5 oz glass of wine (148 mL), or one 1 oz glass of hard liquor (44 mL). Lose weight if told by your health care provider. Work with your dietitian to find an eating plan and weight loss strategies that work best for you. General information Talk to your health care provider and dietitian about taking daily supplements. You may be told the following depending on your health and the cause of your kidney stones: Not to take supplements with vitamin C. To take a calcium supplement. To take a daily probiotic supplement. To take other supplements such as magnesium, fish oil, or vitamin B6. Take over-the-counter and prescription medicines only as told by your health care provider. These include supplements. What foods should I limit? Limit your intake of the following foods, or eat them as told by your dietitian. Vegetables Spinach. Rhubarb. Beets. Canned vegetables. Pickles. Olives. Baked potatoes with skin. Grains Wheat bran. Baked goods. Salted crackers. Cereals high in sugar. Meats and other proteins Nuts. Nut butters. Large portions of meat, poultry, or fish. Salted, precooked, or cured meats, such as sausages, meat loaves, and hot dogs. Dairy Cheese. Beverages Regular soft drinks. Regular vegetable juice. Seasonings and condiments Seasoning blends with salt. Salad dressings. Soy sauce. Ketchup. Barbecue sauce. Other foods Canned soups. Canned pasta sauce. Casseroles. Pizza. Lasagna. Frozen meals. Potato chips. French fries. The items listed above may not be a complete list of foods and beverages you should limit. Contact a dietitian for more information. What foods should I avoid? Talk to your dietitian about specific foods you should avoid based on the type of kidney stones you have and your overall health. Fruits Grapefruit. The item listed above may not be a complete list of foods and beverages you should  avoid. Contact a dietitian for more information. Summary Kidney stones are deposits of minerals and salts that form inside your kidneys. You can lower your risk of kidney stones by making changes to your diet. The most important thing you can do is drink enough fluid. Drink enough fluid to keep your urine pale yellow. Talk to your dietitian about how much calcium you should have each day, and eat less salt and animal protein as told by your dietitian. This information is not intended to replace advice given to you by your health care provider. Make sure you discuss any questions you have with your health care provider. Document Revised: 05/24/2021 Document Reviewed: 05/24/2021 Elsevier Patient Education  2023 [employer]  

## 2022-02-13 LAB — URINE CULTURE

## 2022-08-08 ENCOUNTER — Ambulatory Visit (HOSPITAL_COMMUNITY)
Admission: RE | Admit: 2022-08-08 | Discharge: 2022-08-08 | Disposition: A | Discharge status: Home / Self Care | Payer: Medicare HMO | Source: Ambulatory Visit | Attending: Urology | Admitting: Urology

## 2022-08-08 DIAGNOSIS — N200 Calculus of kidney: Secondary | ICD-10-CM | POA: Diagnosis present

## 2022-08-16 ENCOUNTER — Encounter: Payer: Self-pay | Admitting: Urology

## 2022-08-16 ENCOUNTER — Ambulatory Visit (INDEPENDENT_AMBULATORY_CARE_PROVIDER_SITE_OTHER): Payer: Medicare HMO | Admitting: Urology

## 2022-08-16 VITALS — BP 101/54 | HR 65

## 2022-08-16 DIAGNOSIS — N200 Calculus of kidney: Secondary | ICD-10-CM

## 2022-08-16 DIAGNOSIS — R82994 Hypercalciuria: Secondary | ICD-10-CM | POA: Diagnosis not present

## 2022-08-16 DIAGNOSIS — E291 Testicular hypofunction: Secondary | ICD-10-CM | POA: Diagnosis not present

## 2022-08-16 DIAGNOSIS — R350 Frequency of micturition: Secondary | ICD-10-CM | POA: Diagnosis not present

## 2022-08-16 MED ORDER — TADALAFIL 20 MG PO TABS: 20.0000 mg | ORAL_TABLET | Freq: Every day | ORAL | 5 refills | Status: AC | PRN

## 2022-08-16 MED ORDER — POTASSIUM CITRATE ER 10 MEQ (1080 MG) PO TBCR: 20.0000 meq | EXTENDED_RELEASE_TABLET | Freq: Two times a day (BID) | ORAL | 11 refills | Status: DC

## 2022-08-16 MED ORDER — TAMSULOSIN HCL 0.4 MG PO CAPS: 0.4000 mg | ORAL_CAPSULE | Freq: Two times a day (BID) | ORAL | 11 refills | Status: DC

## 2022-08-16 MED ORDER — MIRABEGRON ER 25 MG PO TB24: 25.0000 mg | ORAL_TABLET | Freq: Every day | ORAL | 0 refills | Status: DC

## 2022-08-16 NOTE — Progress Notes (Signed)
08/16/2022 8:53 AM   Thomas Alexander July 15, 1971 [id number]  Referring provider: Allie Dimmer, [place] [address]  Followup nephrolithiasis and BPH   HPI: Thomas Alexander is a 51yo here for followup for BPH and nephrolithiasis. IPSS 13 QOL 4 on flomax 0.90m daily. He has urinary frequency which bothers him. He has urinary frequency every 45-60 minutes. Urine stream strong. Nocturia 2x. Renal [place]11/13 shows no hydronephrosis and no hydronephrosis   PMH: Past Medical History:  Diagnosis Date   Anxiety    Crohn disease ([place] 2008   Diabetes mellitus without complication ([facility]    Sleep apnea     Surgical History: Past Surgical History:  Procedure Laterality Date   COLECTOMY     x 7 for crohns   COLONOSCOPY     CYSTOSCOPY  2020   CYSTOSCOPY W/ URETERAL STENT REMOVAL  07/29/2021   Procedure: CYSTOSCOPY WITH RIGHT URETERAL STENT REMOVAL;  Surgeon: MCleon Gustin MD;  Location: [facility];  Service: Urology;;   CYSTOSCOPY WITH RETROGRADE PYELOGRAM, URETEROSCOPY AND STENT PLACEMENT Right 07/15/2021   Procedure: CYSTOSCOPY WITH RETROGRADE PYELOGRAM, URETEROSCOPY AND STENT PLACEMENT;  Surgeon: MCleon Gustin MD;  Location: [facility];  Service: Urology;  Laterality: Right;   CYSTOSCOPY WITH RETROGRADE PYELOGRAM, URETEROSCOPY AND STENT PLACEMENT Left 07/29/2021   Procedure: CYSTOSCOPY WITH LEFT RETROGRADE PYELOGRAM, LEFT URETEROSCOPY AND LEFT URETERAL STENT PLACEMENT;  Surgeon: MCleon Gustin MD;  Location: [facility];  Service: Urology;  Laterality: Left;   CYSTOSCOPY/URETEROSCOPY/HOLMIUM LASER/STENT PLACEMENT Right 09/16/2021   Procedure: CYSTOSCOPY/URETEROSCOPY/STENT PLACEMENT;  Surgeon: MCleon Gustin MD;  Location: [facility];  Service: Urology;  Laterality: Right;   HAND SURGERY Left    removal of cyst   HOLMIUM LASER APPLICATION Right 168/08/7516  Procedure: HOLMIUM LASER APPLICATION;  Surgeon: MCleon Gustin MD;  Location: [facility];   Service: Urology;  Laterality: Right;   HOLMIUM LASER APPLICATION Left 100/09/7492  Procedure: HOLMIUM LASER APPLICATION;  Surgeon: MCleon Gustin MD;  Location: [facility];  Service: Urology;  Laterality: Left;   KNEE SURGERY Left    STONE EXTRACTION WITH BASKET Left 07/29/2021   Procedure: STONE EXTRACTION WITH BASKET;  Surgeon: MCleon Gustin MD;  Location: [facility];  Service: Urology;  Laterality: Left;    Home Medications:  Allergies as of 08/16/2022       Reactions   Nsaids Other (See Comments)   Crohns Disease; takes Humira   Fish Oil Other (See Comments)   Other Reaction: BURNING IN THROAT, STOMACH - pt unaware of this reaction   Sulfamethoxazole-trimethoprim    nausea        Medication List        Accurate as of August 16, 2022  8:53 AM. If you have any questions, ask your nurse or doctor.          atenolol 50 MG tablet Commonly known as: TENORMIN Take 50 mg by mouth daily.   cetirizine 10 MG tablet Commonly known as: ZYRTEC Take 10 mg by mouth daily as needed for allergies.   citalopram 20 MG tablet Commonly known as: CELEXA Take 20 mg by mouth daily.   cyanocobalamin 1000 MCG/ML injection Commonly known as: VITAMIN B12 Inject 1,000 mcg into the muscle every 30 (thirty) days.   dicyclomine 10 MG capsule Commonly known as: BENTYL Take 20 mg by mouth 4 (four) times daily -  before meals and at bedtime.   gabapentin 100 MG capsule Commonly known as:  NEURONTIN Take 100 mg by mouth at bedtime.   glimepiride 4 MG tablet Commonly known as: AMARYL Take 4 mg by mouth 2 (two) times daily with a meal.   Humira Pen 40 MG/0.4ML Pnkt Generic drug: Adalimumab Inject 40 mg into the skin once a week.   indapamide 2.5 MG tablet Commonly known as: LOZOL Take 1 tablet (2.5 mg total) by mouth daily.   Jardiance 25 MG Tabs tablet Generic drug: empagliflozin Take 25 mg by mouth every morning.   lovastatin 10 MG tablet Commonly known as: MEVACOR Take  10 mg by mouth at bedtime.   magnesium oxide 400 MG tablet Commonly known as: MAG-OX Take 1 tablet (400 mg total) by mouth daily.   nitrofurantoin (macrocrystal-monohydrate) 100 MG capsule Commonly known as: MACROBID Take 1 capsule (100 mg total) by mouth every 12 (twelve) hours.   ondansetron 4 MG disintegrating tablet Commonly known as: ZOFRAN-ODT Take 1 tablet (4 mg total) by mouth every 6 (six) hours as needed.   oxyCODONE-acetaminophen 5-325 MG tablet Commonly known as: Percocet Take 1 tablet by mouth every 4 (four) hours as needed for severe pain.   potassium citrate 10 MEQ (1080 MG) SR tablet Commonly known as: UROCIT-K Take 2 tablets (20 mEq total) by mouth in the morning and at bedtime.   sulfamethoxazole-trimethoprim 800-160 MG tablet Commonly known as: BACTRIM DS Take 1 tablet by mouth every 12 (twelve) hours.   tamsulosin 0.4 MG Caps capsule Commonly known as: FLOMAX Take 1 capsule (0.4 mg total) by mouth 2 (two) times daily.   TUMS ULTRA 1000 PO Take 1 tablet by mouth 3 (three) times daily as needed (heartburn).   UltiCare Tuberculin Safety Syr 25G X 5/8" 1 ML Misc Generic drug: TUBERCULIN SYR 1CC/25GX5/8"   Vitamin D (Ergocalciferol) 1.25 MG (50000 UNIT) Caps capsule Commonly known as: DRISDOL Take 50,000 Units by mouth every 7 (seven) days.        Allergies:  Allergies  Allergen Reactions   Nsaids Other (See Comments)    Crohns Disease; takes Humira   Fish Oil Other (See Comments)    Other Reaction: BURNING IN THROAT, STOMACH - pt unaware of this reaction   Sulfamethoxazole-Trimethoprim     nausea     Family History: Family History  Family history unknown: Yes    Social History:  reports that he has been smoking cigarettes. He has been smoking an average of .5 packs per day. He does not have any smokeless tobacco history on file. He reports that he does not currently use drugs after having used the following drugs: Marijuana. No history on  file for alcohol use.  ROS: All other review of systems were reviewed and are negative except what is noted above in HPI  Physical Exam: BP (!) 101/54   Pulse 65   Constitutional:  Alert and oriented, No acute distress. HEENT: [place] AT, moist mucus membranes.  Trachea midline, no masses. Cardiovascular: No clubbing, cyanosis, or edema. Respiratory: Normal respiratory effort, no increased work of breathing. GI: Abdomen is soft, nontender, nondistended, no abdominal masses GU: No CVA tenderness.  Lymph: No cervical or inguinal lymphadenopathy. Skin: No rashes, bruises or suspicious lesions. Neurologic: Grossly intact, no focal deficits, moving all 4 extremities. Psychiatric: Normal mood and affect.  Laboratory Data: Lab Results  Component Value Date   WBC 5.1 07/13/2021   HGB 14.6 07/13/2021   HCT 42.2 07/13/2021   MCV 99 (H) 07/13/2021   PLT 81 (LL) 07/13/2021    Lab Results  Component Value Date   CREATININE 0.83 08/04/2021    No results found for: "PSA"  No results found for: "TESTOSTERONE"  No results found for: "HGBA1C"  Urinalysis    Component Value Date/Time   APPEARANCEUR Clear 02/11/2022 1052   GLUCOSEU 3+ (A) 02/11/2022 1052   BILIRUBINUR Negative 02/11/2022 1052   PROTEINUR Negative 02/11/2022 1052   NITRITE Negative 02/11/2022 1052   LEUKOCYTESUR Trace (A) 02/11/2022 1052    Lab Results  Component Value Date   LABMICR See below: 02/11/2022   WBCUA 6-10 (A) 02/11/2022   LABEPIT None seen 02/11/2022   MUCUS Present 02/11/2022   BACTERIA Moderate (A) 02/11/2022    Pertinent Imaging: Renal US 08/08/2022: Images reviewed and discussed with the patient  Results for orders placed in visit on 08/09/21  DG Abd 1 View  Narrative CLINICAL DATA:  Kidney stone  EXAM: ABDOMEN - 1 VIEW  COMPARISON:  Knee radiograph dated July 13, 2021  FINDINGS: Normal bowel gas pattern. Decreased size and number of renal stones when compared with prior exam.  Largest stone of the right kidney measures 7 mm and is located in the lower pole. Largest stone of the left kidney measures 6 mm and is located in lower pole. Right pelvic calcification is less well seen on today's exam and is also likely decreased in size compared to prior exam.  IMPRESSION: Bilateral nephrolithiasis, overall decreased in size and number when compared with prior exam.   Electronically Signed By: Yetta Glassman M.D. On: 08/10/2021 13:20  No results found for this or any previous visit.  No results found for this or any previous visit.  No results found for this or any previous visit.  Results for orders placed during the hospital encounter of 08/08/22  Ultrasound renal complete  Narrative CLINICAL DATA:  51 year old male with history of kidney stones.  EXAM: RENAL / URINARY TRACT ULTRASOUND COMPLETE  COMPARISON:  Renal ultrasound 02/09/2022. Abdominal radiographs 08/09/2021.  FINDINGS: Right Kidney:  Renal measurements: 12.4 x 7.5 x 6.1 cm = volume: 294 mL. Echogenicity within normal limits. No mass or hydronephrosis visualized.  Left Kidney:  Renal measurements: 12.9 x 7.0 x 5.6 cm = volume: 262 mL. Echogenicity within normal limits. No mass or hydronephrosis visualized.  No discrete nephrolithiasis is evident by ultrasound.  Bladder:  Appears normal for degree of bladder distention. Both ureteral jets visible with Doppler.  Other:  None.  IMPRESSION: Normal for age ultrasound appearance of both kidneys and the bladder. No nephrolithiasis evident by ultrasound.   Electronically Signed By: Genevie Ann M.D. On: 08/08/2022 08:58  No valid procedures specified. No results found for this or any previous visit.  No results found for this or any previous visit.   Assessment & Plan:    1. Kidney stones -Continue urocit K RTC 6 monthd with renal US  - Urinalysis, Routine w reflex microscopic - BLADDER SCAN AMB NON-IMAGING  2.  Urinary frequency -Continue flomax 0.57m daily -We will trial mirabegron 224mdaily - BLADDER SCAN AMB NON-IMAGING  3. Hypercalciuria -urocit K 2079mID   No follow-ups on file.  PatNicolette BangD  [facility] [place]

## 2022-08-16 NOTE — Patient Instructions (Signed)

## 2022-08-16 NOTE — Progress Notes (Signed)
post void residual=77

## 2022-08-17 LAB — URINALYSIS, ROUTINE W REFLEX MICROSCOPIC
Bilirubin, UA: NEGATIVE
Ketones, UA: NEGATIVE
Nitrite, UA: NEGATIVE
Protein,UA: NEGATIVE
Specific Gravity, UA: 1.01 (ref 1.005–1.030)
Urobilinogen, Ur: 1 mg/dL (ref 0.2–1.0)
pH, UA: 7 (ref 5.0–7.5)

## 2022-08-17 LAB — MICROSCOPIC EXAMINATION
Bacteria, UA: NONE SEEN
WBC, UA: >30 /hpf — AB (ref 0–5)

## 2022-08-20 LAB — TESTOSTERONE,FREE AND TOTAL
Testosterone, Free: 1.8 pg/mL — ABNORMAL LOW (ref 7.2–24.0)
Testosterone: 447 ng/dL (ref 264–916)

## 2022-10-03 ENCOUNTER — Other Ambulatory Visit: Payer: Self-pay

## 2022-10-03 ENCOUNTER — Other Ambulatory Visit: Payer: Self-pay | Admitting: Urology

## 2022-10-03 MED ORDER — MIRABEGRON ER 25 MG PO TB24: 25.0000 mg | ORAL_TABLET | Freq: Every day | ORAL | 11 refills | Status: AC

## 2022-10-03 NOTE — Telephone Encounter (Signed)
Tried calling patient with no answer and left vm to return call to office.

## 2023-01-20 ENCOUNTER — Ambulatory Visit (HOSPITAL_COMMUNITY): Payer: Medicare HMO

## 2023-02-01 ENCOUNTER — Ambulatory Visit (HOSPITAL_COMMUNITY)
Admission: RE | Admit: 2023-02-01 | Discharge: 2023-02-01 | Disposition: A | Discharge status: Home / Self Care | Payer: Medicare HMO | Source: Ambulatory Visit | Attending: Urology | Admitting: Urology

## 2023-02-01 DIAGNOSIS — N200 Calculus of kidney: Secondary | ICD-10-CM | POA: Diagnosis not present

## 2023-02-03 ENCOUNTER — Ambulatory Visit (HOSPITAL_COMMUNITY): Payer: Medicare HMO

## 2023-02-06 ENCOUNTER — Ambulatory Visit: Payer: Medicare HMO | Admitting: Urology

## 2023-02-06 VITALS — BP 107/62 | HR 64

## 2023-02-06 DIAGNOSIS — G8929 Other chronic pain: Secondary | ICD-10-CM | POA: Diagnosis not present

## 2023-02-06 DIAGNOSIS — M545 Low back pain, unspecified: Secondary | ICD-10-CM

## 2023-02-06 DIAGNOSIS — N200 Calculus of kidney: Secondary | ICD-10-CM | POA: Diagnosis not present

## 2023-02-06 DIAGNOSIS — R82994 Hypercalciuria: Secondary | ICD-10-CM | POA: Diagnosis not present

## 2023-02-06 LAB — URINALYSIS, ROUTINE W REFLEX MICROSCOPIC
Bilirubin, UA: NEGATIVE
Ketones, UA: NEGATIVE
Nitrite, UA: NEGATIVE
Protein,UA: NEGATIVE
Specific Gravity, UA: 1.01 (ref 1.005–1.030)
Urobilinogen, Ur: 1 mg/dL (ref 0.2–1.0)
pH, UA: 6.5 (ref 5.0–7.5)

## 2023-02-06 LAB — MICROSCOPIC EXAMINATION: Bacteria, UA: NONE SEEN

## 2023-02-06 LAB — BLADDER SCAN AMB NON-IMAGING: Scan Result: 77

## 2023-02-06 MED ORDER — INDAPAMIDE 2.5 MG PO TABS: 2.5000 mg | ORAL_TABLET | Freq: Every day | ORAL | 11 refills | Status: DC

## 2023-02-06 MED ORDER — POTASSIUM CITRATE ER 10 MEQ (1080 MG) PO TBCR: 20.0000 meq | EXTENDED_RELEASE_TABLET | Freq: Two times a day (BID) | ORAL | 11 refills | Status: DC

## 2023-02-06 MED ORDER — CYCLOBENZAPRINE HCL 5 MG PO TABS
5.0000 mg | ORAL_TABLET | Freq: Three times a day (TID) | ORAL | 1 refills | Status: AC | PRN
Start: 2023-02-06 — End: ?

## 2023-02-06 NOTE — Progress Notes (Unsigned)
02/06/2023 10:14 AM   [name] 18-Jul-1971 [id number]  Referring provider: Majel Homer, MD [id number] [facility] [address] [place],  [place] [address]  No chief complaint on file.   HPI: Renal US shows no calculi and no hydronephrosis   PMH: Past Medical History:  Diagnosis Date   Anxiety    Crohn disease ([facility]) 2008   Diabetes mellitus without complication ([facility])    Sleep apnea     Surgical History: Past Surgical History:  Procedure Laterality Date   COLECTOMY     x 7 for crohns   COLONOSCOPY     CYSTOSCOPY  2020   CYSTOSCOPY W/ URETERAL STENT REMOVAL  07/29/2021   Procedure: CYSTOSCOPY WITH RIGHT URETERAL STENT REMOVAL;  Surgeon: Malen Gauze, MD;  Location: [facility];  Service: Urology;;   CYSTOSCOPY WITH RETROGRADE PYELOGRAM, URETEROSCOPY AND STENT PLACEMENT Right 07/15/2021   Procedure: CYSTOSCOPY WITH RETROGRADE PYELOGRAM, URETEROSCOPY AND STENT PLACEMENT;  Surgeon: Malen Gauze, MD;  Location: [facility];  Service: Urology;  Laterality: Right;   CYSTOSCOPY WITH RETROGRADE PYELOGRAM, URETEROSCOPY AND STENT PLACEMENT Left 07/29/2021   Procedure: CYSTOSCOPY WITH LEFT RETROGRADE PYELOGRAM, LEFT URETEROSCOPY AND LEFT URETERAL STENT PLACEMENT;  Surgeon: Malen Gauze, MD;  Location: [facility];  Service: Urology;  Laterality: Left;   CYSTOSCOPY/URETEROSCOPY/HOLMIUM LASER/STENT PLACEMENT Right 09/16/2021   Procedure: CYSTOSCOPY/URETEROSCOPY/STENT PLACEMENT;  Surgeon: Malen Gauze, MD;  Location: [facility];  Service: Urology;  Laterality: Right;   HAND SURGERY Left    removal of cyst   HOLMIUM LASER APPLICATION Right 07/15/2021   Procedure: HOLMIUM LASER APPLICATION;  Surgeon: Malen Gauze, MD;  Location: [facility];  Service: Urology;  Laterality: Right;   HOLMIUM LASER APPLICATION Left 07/29/2021   Procedure: HOLMIUM LASER APPLICATION;  Surgeon: Malen Gauze, MD;  Location: [facility];  Service: Urology;  Laterality: Left;   KNEE SURGERY Left    STONE  EXTRACTION WITH BASKET Left 07/29/2021   Procedure: STONE EXTRACTION WITH BASKET;  Surgeon: Malen Gauze, MD;  Location: [facility];  Service: Urology;  Laterality: Left;    Home Medications:  Allergies as of 02/06/2023       Reactions   Nsaids Other (See Comments)   Crohns Disease; takes Humira   Fish Oil Other (See Comments)   Other Reaction: BURNING IN THROAT, STOMACH - pt unaware of this reaction   Sulfamethoxazole-trimethoprim    nausea        Medication List        Accurate as of Feb 06, 2023 10:14 AM. If you have any questions, ask your nurse or doctor.          atenolol 50 MG tablet Commonly known as: TENORMIN Take 50 mg by mouth daily.   cetirizine 10 MG tablet Commonly known as: ZYRTEC Take 10 mg by mouth daily as needed for allergies.   citalopram 20 MG tablet Commonly known as: CELEXA Take 20 mg by mouth daily.   cyanocobalamin 1000 MCG/ML injection Commonly known as: VITAMIN B12 Inject 1,000 mcg into the muscle every 30 (thirty) days.   dicyclomine 10 MG capsule Commonly known as: BENTYL Take 20 mg by mouth 4 (four) times daily -  before meals and at bedtime.   gabapentin 100 MG capsule Commonly known as: NEURONTIN Take 100 mg by mouth at bedtime.   glimepiride 4 MG tablet Commonly known as: AMARYL Take 4 mg by mouth 2 (two) times daily with a meal.   Humira (2 Pen) 40 MG/0.4ML Pnkt Generic drug:  Adalimumab Inject 40 mg into the skin once a week.   indapamide 2.5 MG tablet Commonly known as: LOZOL Take 1 tablet (2.5 mg total) by mouth daily.   Jardiance 25 MG Tabs tablet Generic drug: empagliflozin Take 25 mg by mouth every morning.   lovastatin 10 MG tablet Commonly known as: MEVACOR Take 10 mg by mouth at bedtime.   magnesium oxide 400 MG tablet Commonly known as: MAG-OX Take 1 tablet (400 mg total) by mouth daily.   mirabegron ER 25 MG Tb24 tablet Commonly known as: MYRBETRIQ Take 1 tablet (25 mg total) by mouth daily.    nitrofurantoin (macrocrystal-monohydrate) 100 MG capsule Commonly known as: MACROBID Take 1 capsule (100 mg total) by mouth every 12 (twelve) hours.   ondansetron 4 MG disintegrating tablet Commonly known as: ZOFRAN-ODT Take 1 tablet (4 mg total) by mouth every 6 (six) hours as needed.   potassium citrate 10 MEQ (1080 MG) SR tablet Commonly known as: UROCIT-K Take 2 tablets (20 mEq total) by mouth in the morning and at bedtime.   sulfamethoxazole-trimethoprim 800-160 MG tablet Commonly known as: BACTRIM DS Take 1 tablet by mouth every 12 (twelve) hours.   tadalafil 20 MG tablet Commonly known as: CIALIS Take 1 tablet (20 mg total) by mouth daily as needed.   tamsulosin 0.4 MG Caps capsule Commonly known as: FLOMAX Take 1 capsule (0.4 mg total) by mouth 2 (two) times daily.   TUMS ULTRA 1000 PO Take 1 tablet by mouth 3 (three) times daily as needed (heartburn).   UltiCare Tuberculin Safety Syr 25G X 5/8" 1 ML Misc Generic drug: TUBERCULIN SYR 1CC/25GX5/8"   Vitamin D (Ergocalciferol) 1.25 MG (50000 UNIT) Caps capsule Commonly known as: DRISDOL Take 50,000 Units by mouth every 7 (seven) days.        Allergies:  Allergies  Allergen Reactions   Nsaids Other (See Comments)    Crohns Disease; takes Humira   Fish Oil Other (See Comments)    Other Reaction: BURNING IN THROAT, STOMACH - pt unaware of this reaction   Sulfamethoxazole-Trimethoprim     nausea     Family History: Family History  Family history unknown: Yes    Social History:  reports that he has been smoking cigarettes. He has been smoking an average of .5 packs per day. He does not have any smokeless tobacco history on file. He reports that he does not currently use drugs after having used the following drugs: Marijuana. No history on file for alcohol use.  ROS: All other review of systems were reviewed and are negative except what is noted above in HPI  Physical Exam: BP 107/62   Pulse 64    Constitutional:  Alert and oriented, No acute distress. HEENT: [place] AT, moist mucus membranes.  Trachea midline, no masses. Cardiovascular: No clubbing, cyanosis, or edema. Respiratory: Normal respiratory effort, no increased work of breathing. GI: Abdomen is soft, nontender, nondistended, no abdominal masses GU: No CVA tenderness.  Lymph: No cervical or inguinal lymphadenopathy. Skin: No rashes, bruises or suspicious lesions. Neurologic: Grossly intact, no focal deficits, moving all 4 extremities. Psychiatric: Normal mood and affect.  Laboratory Data: Lab Results  Component Value Date   WBC 5.1 07/13/2021   HGB 14.6 07/13/2021   HCT 42.2 07/13/2021   MCV 99 (H) 07/13/2021   PLT 81 (LL) 07/13/2021    Lab Results  Component Value Date   CREATININE 0.83 08/04/2021    No results found for: "PSA"  Lab Results  Component  Value Date   TESTOSTERONE 447 08/16/2022    No results found for: "HGBA1C"  Urinalysis    Component Value Date/Time   APPEARANCEUR Cloudy (A) 08/16/2022 0844   GLUCOSEU 3+ (A) 08/16/2022 0844   BILIRUBINUR Negative 08/16/2022 0844   PROTEINUR Negative 08/16/2022 0844   NITRITE Negative 08/16/2022 0844   LEUKOCYTESUR 1+ (A) 08/16/2022 0844    Lab Results  Component Value Date   LABMICR See below: 08/16/2022   WBCUA >30 (A) 08/16/2022   LABEPIT 0-10 08/16/2022   MUCUS Present 02/11/2022   BACTERIA None seen 08/16/2022    Pertinent Imaging: *** Results for orders placed in visit on 08/09/21  DG Abd 1 View  Narrative CLINICAL DATA:  Kidney stone  EXAM: ABDOMEN - 1 VIEW  COMPARISON:  Knee radiograph dated July 13, 2021  FINDINGS: Normal bowel gas pattern. Decreased size and number of renal stones when compared with prior exam. Largest stone of the right kidney measures 7 mm and is located in the lower pole. Largest stone of the left kidney measures 6 mm and is located in lower pole. Right pelvic calcification is less well seen on  today's exam and is also likely decreased in size compared to prior exam.  IMPRESSION: Bilateral nephrolithiasis, overall decreased in size and number when compared with prior exam.   Electronically Signed By: Allegra Lai M.D. On: 08/10/2021 13:20  No results found for this or any previous visit.  No results found for this or any previous visit.  No results found for this or any previous visit.  Results for orders placed during the hospital encounter of 02/01/23  Ultrasound renal complete  Narrative CLINICAL DATA:  Nephrolithiasis follow-up  EXAM: RENAL / URINARY TRACT ULTRASOUND COMPLETE  COMPARISON:  None Available.  FINDINGS: Right Kidney:  Renal measurements: 12.8 x 7.0 x 6.5 cm = volume: 302 mL. Echogenicity within normal limits. No mass or hydronephrosis visualized.  Left Kidney:  Renal measurements: 12.6 x 6.7 x 5.8 cm = volume: 257 mL. Echogenicity within normal limits. No mass or hydronephrosis visualized.  Bladder:  Appears normal for degree of bladder distention.  Other:  None.  IMPRESSION: Normal study. No renal stones identified.   Electronically Signed By: Gerome Sam III M.D. On: 02/03/2023 18:54  No valid procedures specified. No results found for this or any previous visit.  No results found for this or any previous visit.   Assessment & Plan:    1. Kidney stones -followup 1 year with renal US - Urinalysis, Routine w reflex microscopic  2. Hypercalciuria -continue indapamide 2.5mg  daily, continue urocitK BID  3. Chronic bilateral low back pain without sciatica -flexeril 5mg  TID prn   No follow-ups on file.  Wilkie Aye, MD  [facility] [place]

## 2023-02-09 ENCOUNTER — Encounter: Payer: Self-pay | Admitting: Urology

## 2023-02-09 NOTE — Patient Instructions (Signed)

## 2023-09-04 NOTE — Progress Notes (Unsigned)
Name: Thomas Alexander DOB: 01-17-1971 MRN: [id number]  History of Present Illness: Thomas Alexander is a 52 y.o. adult who presents today for follow up visit at [facility]. - GU History: 1. Kidney stones with hypercalciuria. - Taking Indapamide 2.5 mg daily and Urocit K 20 mEq 2x/day.  At last visit with Thomas Alexander on 02/06/2023: - Asymptomatic for stones. - Had normal RUS on 02/01/2023. - Was prescribed Flexeril 5mg  TID prn for his chronic bilateral low back pain.  Since last visit:  > 06/20/2023: Urine culture was positive for >10k Candida albicans. Treated at [facility].   Today: He reports that since Saturday 09/02/2023 he started passing some small material in his urine which was brown - possible clots; he pushed on them with a Q-tip and they were soft so he feels confident they weren't stones. Denies seeing any blood in the urine; denies history of gross hematuria. Reports dysuria for the past several months.  He denies hesitancy, straining to void, or sensations of incomplete emptying. He denies acute flank or abdominal pain. He denies fevers, nausea, or vomiting. At baseline he reports urinary urgency and frequency.   No prior PSA results found per chart review; patient denies having that checked previously. He denies family history of prostate cancer.   Fall Screening: Do you usually have a device to assist in your mobility? No   Medications: Current Outpatient Medications  Medication Sig Dispense Refill   Adalimumab (HUMIRA PEN) 40 MG/0.4ML PNKT Inject 40 mg into the skin once a week.     atenolol (TENORMIN) 50 MG tablet Take 50 mg by mouth daily.     Calcium Carbonate Antacid (TUMS ULTRA 1000 PO) Take 1 tablet by mouth 3 (three) times daily as needed (heartburn).     cetirizine (ZYRTEC) 10 MG tablet Take 10 mg by mouth daily as needed for allergies.     citalopram (CELEXA) 20 MG tablet Take 20 mg by mouth daily.     cyanocobalamin (,VITAMIN  B-12,) 1000 MCG/ML injection Inject 1,000 mcg into the muscle every 30 (thirty) days.     cyclobenzaprine (FLEXERIL) 5 MG tablet Take 1 tablet (5 mg total) by mouth 3 (three) times daily as needed for muscle spasms. 30 tablet 1   dicyclomine (BENTYL) 10 MG capsule Take 20 mg by mouth 4 (four) times daily -  before meals and at bedtime.     gabapentin (NEURONTIN) 100 MG capsule Take 100 mg by mouth at bedtime.     glimepiride (AMARYL) 4 MG tablet Take 4 mg by mouth 2 (two) times daily with a meal.     indapamide (LOZOL) 2.5 MG tablet Take 1 tablet (2.5 mg total) by mouth daily. 30 tablet 11   JARDIANCE 25 MG TABS tablet Take 25 mg by mouth every morning.     lovastatin (MEVACOR) 10 MG tablet Take 10 mg by mouth at bedtime.     magnesium oxide (MAG-OX) 400 MG tablet Take 1 tablet (400 mg total) by mouth daily. 30 tablet 11   mirabegron ER (MYRBETRIQ) 25 MG TB24 tablet Take 1 tablet (25 mg total) by mouth daily. 30 tablet 11   nitrofurantoin, macrocrystal-monohydrate, (MACROBID) 100 MG capsule Take 1 capsule (100 mg total) by mouth every 12 (twelve) hours. 14 capsule 0   ondansetron (ZOFRAN-ODT) 4 MG disintegrating tablet Take 1 tablet (4 mg total) by mouth every 6 (six) hours as needed. 30 tablet 0   potassium citrate (UROCIT-K) 10 MEQ (1080 MG) SR  tablet Take 2 tablets (20 mEq total) by mouth in the morning and at bedtime. 120 tablet 11   sulfamethoxazole-trimethoprim (BACTRIM DS) 800-160 MG tablet Take 1 tablet by mouth every 12 (twelve) hours. 14 tablet 0   tadalafil (CIALIS) 20 MG tablet Take 1 tablet (20 mg total) by mouth daily as needed. 10 tablet 5   tamsulosin (FLOMAX) 0.4 MG CAPS capsule Take 1 capsule (0.4 mg total) by mouth 2 (two) times daily. 60 capsule 11   ULTICARE TUBERCULIN SAFETY SYR 25G X 5/8" 1 ML MISC      Vitamin D, Ergocalciferol, (DRISDOL) 1.25 MG (50000 UNIT) CAPS capsule Take 50,000 Units by mouth every 7 (seven) days.     No current facility-administered medications for  this visit.    Allergies: Allergies  Allergen Reactions   Nsaids Other (See Comments)    Crohns Disease; takes Humira   Fish Oil Other (See Comments)    Other Reaction: BURNING IN THROAT, STOMACH - pt unaware of this reaction   Sulfamethoxazole-Trimethoprim     nausea     Past Medical History:  Diagnosis Date   Anxiety    Crohn disease (HCC) 2008   Diabetes mellitus without complication (HCC)    Sleep apnea    Past Surgical History:  Procedure Laterality Date   COLECTOMY     x 7 for crohns   COLONOSCOPY     CYSTOSCOPY  2020   CYSTOSCOPY W/ URETERAL STENT REMOVAL  07/29/2021   Procedure: CYSTOSCOPY WITH RIGHT URETERAL STENT REMOVAL;  Surgeon: Thomas Gauze, MD;  Location: [facility];  Service: Urology;;   CYSTOSCOPY WITH RETROGRADE PYELOGRAM, URETEROSCOPY AND STENT PLACEMENT Right 07/15/2021   Procedure: CYSTOSCOPY WITH RETROGRADE PYELOGRAM, URETEROSCOPY AND STENT PLACEMENT;  Surgeon: Thomas Gauze, MD;  Location: [facility];  Service: Urology;  Laterality: Right;   CYSTOSCOPY WITH RETROGRADE PYELOGRAM, URETEROSCOPY AND STENT PLACEMENT Left 07/29/2021   Procedure: CYSTOSCOPY WITH LEFT RETROGRADE PYELOGRAM, LEFT URETEROSCOPY AND LEFT URETERAL STENT PLACEMENT;  Surgeon: Thomas Gauze, MD;  Location: [facility];  Service: Urology;  Laterality: Left;   CYSTOSCOPY/URETEROSCOPY/HOLMIUM LASER/STENT PLACEMENT Right 09/16/2021   Procedure: CYSTOSCOPY/URETEROSCOPY/STENT PLACEMENT;  Surgeon: Thomas Gauze, MD;  Location: [facility];  Service: Urology;  Laterality: Right;   HAND SURGERY Left    removal of cyst   HOLMIUM LASER APPLICATION Right 07/15/2021   Procedure: HOLMIUM LASER APPLICATION;  Surgeon: Thomas Gauze, MD;  Location: [facility];  Service: Urology;  Laterality: Right;   HOLMIUM LASER APPLICATION Left 07/29/2021   Procedure: HOLMIUM LASER APPLICATION;  Surgeon: Thomas Gauze, MD;  Location: [facility];  Service: Urology;  Laterality: Left;   KNEE SURGERY Left     STONE EXTRACTION WITH BASKET Left 07/29/2021   Procedure: STONE EXTRACTION WITH BASKET;  Surgeon: Thomas Gauze, MD;  Location: [facility];  Service: Urology;  Laterality: Left;   Family History  Family history unknown: Yes   Social History   Socioeconomic History   Marital status: Married    Spouse name: Not on file   Number of children: Not on file   Years of education: Not on file   Highest education level: Not on file  Occupational History   Not on file  Tobacco Use   Smoking status: Every Day    Current packs/day: 0.50    Types: Cigarettes   Smokeless tobacco: Not on file  Vaping Use   Vaping status: Never Used  Substance and Sexual Activity   Alcohol use: Not  on file   Drug use: Not Currently    Types: Marijuana   Sexual activity: Not on file  Other Topics Concern   Not on file  Social History Narrative   Not on file   Social Determinants of Health   Financial Resource Strain: Not on file  Food Insecurity: Not on file  Transportation Needs: Not on file  Physical Activity: Not on file  Stress: Not on file  Social Connections: Not on file  Intimate Partner Violence: Not on file    SUBJECTIVE  Review of Systems Constitutional: Patient denies any unintentional weight loss or change in strength lntegumentary: Patient denies any rashes or pruritus Cardiovascular: Patient denies chest pain or syncope Respiratory: Patient denies shortness of breath Gastrointestinal: Patient denies nausea, vomiting, constipation, or diarrhea Musculoskeletal: Patient denies muscle cramps or weakness Neurologic: Patient denies convulsions or seizures Allergic/Immunologic: Patient denies recent allergic reaction(s) Hematologic/Lymphatic: Patient denies bleeding tendencies Endocrine: Patient denies heat/cold intolerance  GU: As per HPI.  OBJECTIVE Vitals:   09/05/23 1147  BP: 122/72  Pulse: 60  Temp: 98.2 F (36.8 C)   There is no height or weight on file to calculate  BMI.  Physical Examination Constitutional: No obvious distress; patient is non-toxic appearing  Cardiovascular: No visible lower extremity edema.  Respiratory: The patient does not have audible wheezing/stridor; respirations do not appear labored  Gastrointestinal: Abdomen non-distended Musculoskeletal: Normal ROM of UEs  Skin: No obvious rashes/open sores  Neurologic: CN 2-12 grossly intact Psychiatric: Answered questions appropriately with normal affect  Hematologic/Lymphatic/Immunologic: No obvious bruises or sites of spontaneous bleeding  UA: 6-10 WBC/hpf, 11-30 RBC/hpf, no bacteria, glucose 3+ (secondary to Jardiance use).  PVR: 22 ml  ASSESSMENT Blood clots in urine  Dysuria - Plan: Urine Culture  History of UTI  Abnormal urine - Plan: Urinalysis, Routine w reflex microscopic, BLADDER SCAN AMB NON-IMAGING  Glucosuria  Type 2 diabetes mellitus without complication, with long-term current use of insulin (HCC)  Kidney stones - Plan: Urinalysis, Routine w reflex microscopic, BLADDER SCAN AMB NON-IMAGING  Smoker  Prostate cancer screening - Plan: PSA  We discussed what sounds like recent clots in his urine and with possible etiologies such as stone passage, blood thinner use, UTI, chronic kidney disease, glomerulonephropathy, BPH, malignancy.  Primarily suspect UTI based on abnormal UA today, report of dysuria, history of recent candidal UTI, and increased UTI risk related to glucosuria from Jardiance use / T2DM. We agreed to check urine culture and treat as indicated based on results. Low suspicion for stone at this time.  Patient was advised to schedule cystoscopy for further evaluation however given that UTIs in males are not considered to be common and to rule out malignancy due to nicotine use. We discussed pt's smoking as a risk factor for GU cancer. If urine culture is negative, will also order CT hematuria protocol for further evaluation.   We also agreed to check  PSA today.   Pt decided to pursue this work-up and follow-up afterward to discuss the results and formulate a treatment plan based on the findings. All questions were answered.   PLAN Advised the following: Urine culture. PSA. Return for 1st available cystoscopy with Thomas Alexander.  Orders Placed This Encounter  Procedures   Urine Culture   Urinalysis, Routine w reflex microscopic   PSA   BLADDER SCAN AMB NON-IMAGING    It has been explained that the patient is to follow regularly with their PCP in addition to all other providers involved in  their care and to follow instructions provided by these respective offices. Patient advised to contact urology clinic if any urologic-pertaining questions, concerns, new symptoms or problems arise in the interim period.  There are no Patient Instructions on file for this visit.  Electronically signed by:  Donnita Falls, MSN, FNP-C, CUNP 09/05/2023 12:46 PM

## 2023-09-05 ENCOUNTER — Ambulatory Visit: Payer: Medicare HMO | Admitting: Urology

## 2023-09-05 ENCOUNTER — Encounter: Payer: Self-pay | Admitting: Urology

## 2023-09-05 VITALS — BP 122/72 | HR 60 | Temp 98.2°F

## 2023-09-05 DIAGNOSIS — R8290 Unspecified abnormal findings in urine: Secondary | ICD-10-CM

## 2023-09-05 DIAGNOSIS — R81 Glycosuria: Secondary | ICD-10-CM

## 2023-09-05 DIAGNOSIS — Z125 Encounter for screening for malignant neoplasm of prostate: Secondary | ICD-10-CM

## 2023-09-05 DIAGNOSIS — N200 Calculus of kidney: Secondary | ICD-10-CM

## 2023-09-05 DIAGNOSIS — R300 Dysuria: Secondary | ICD-10-CM

## 2023-09-05 DIAGNOSIS — R310 Gross hematuria: Secondary | ICD-10-CM

## 2023-09-05 DIAGNOSIS — R3989 Other symptoms and signs involving the genitourinary system: Secondary | ICD-10-CM

## 2023-09-05 DIAGNOSIS — Q419 Congenital absence, atresia and stenosis of small intestine, part unspecified: Secondary | ICD-10-CM | POA: Insufficient documentation

## 2023-09-05 DIAGNOSIS — F17200 Nicotine dependence, unspecified, uncomplicated: Secondary | ICD-10-CM

## 2023-09-05 DIAGNOSIS — Z794 Long term (current) use of insulin: Secondary | ICD-10-CM

## 2023-09-05 DIAGNOSIS — K219 Gastro-esophageal reflux disease without esophagitis: Secondary | ICD-10-CM | POA: Insufficient documentation

## 2023-09-05 DIAGNOSIS — Z87440 Personal history of urinary (tract) infections: Secondary | ICD-10-CM

## 2023-09-05 LAB — MICROSCOPIC EXAMINATION: Bacteria, UA: NONE SEEN

## 2023-09-05 LAB — URINALYSIS, ROUTINE W REFLEX MICROSCOPIC
Bilirubin, UA: NEGATIVE
Ketones, UA: NEGATIVE
Nitrite, UA: NEGATIVE
Protein,UA: NEGATIVE
Specific Gravity, UA: 1.01 (ref 1.005–1.030)
Urobilinogen, Ur: 1 mg/dL (ref 0.2–1.0)
pH, UA: 7 (ref 5.0–7.5)

## 2023-09-05 LAB — BLADDER SCAN AMB NON-IMAGING: Scan Result: 22

## 2023-09-06 ENCOUNTER — Telehealth: Payer: Self-pay

## 2023-09-06 LAB — PSA: Prostate Specific Ag, Serum: 0.8 ng/mL (ref 0.0–4.0)

## 2023-09-06 NOTE — Telephone Encounter (Signed)
-----   Message from Thomas Alexander sent at 09/06/2023  8:36 AM EST ----- Please let pt know his PSA is normal. Urine culture result is pending.

## 2023-09-06 NOTE — Telephone Encounter (Signed)
Tried calling patient with no answer left voiced message for return call to office.

## 2023-09-06 NOTE — Telephone Encounter (Signed)
Tried calling patient with no answer left voiced message for return call to office. Letter mailed out.

## 2023-09-07 ENCOUNTER — Other Ambulatory Visit: Payer: Self-pay | Admitting: Urology

## 2023-09-07 ENCOUNTER — Telehealth: Payer: Self-pay

## 2023-09-07 DIAGNOSIS — R310 Gross hematuria: Secondary | ICD-10-CM

## 2023-09-07 DIAGNOSIS — N200 Calculus of kidney: Secondary | ICD-10-CM

## 2023-09-07 DIAGNOSIS — R319 Hematuria, unspecified: Secondary | ICD-10-CM

## 2023-09-07 DIAGNOSIS — R300 Dysuria: Secondary | ICD-10-CM

## 2023-09-07 DIAGNOSIS — F17200 Nicotine dependence, unspecified, uncomplicated: Secondary | ICD-10-CM

## 2023-09-07 LAB — URINE CULTURE

## 2023-09-07 NOTE — Telephone Encounter (Signed)
Message sent to patient via mychart

## 2023-10-10 ENCOUNTER — Ambulatory Visit (HOSPITAL_COMMUNITY)
Admission: RE | Admit: 2023-10-10 | Discharge: 2023-10-10 | Disposition: A | Discharge status: Home / Self Care | Payer: Medicare HMO | Source: Ambulatory Visit | Attending: Urology | Admitting: Urology

## 2023-10-10 ENCOUNTER — Encounter (HOSPITAL_COMMUNITY): Payer: Self-pay | Admitting: Radiology

## 2023-10-10 DIAGNOSIS — F17200 Nicotine dependence, unspecified, uncomplicated: Secondary | ICD-10-CM | POA: Diagnosis present

## 2023-10-10 DIAGNOSIS — N200 Calculus of kidney: Secondary | ICD-10-CM | POA: Insufficient documentation

## 2023-10-10 DIAGNOSIS — R310 Gross hematuria: Secondary | ICD-10-CM | POA: Diagnosis present

## 2023-10-10 DIAGNOSIS — R300 Dysuria: Secondary | ICD-10-CM | POA: Insufficient documentation

## 2023-10-10 DIAGNOSIS — R319 Hematuria, unspecified: Secondary | ICD-10-CM | POA: Insufficient documentation

## 2023-10-10 LAB — POCT I-STAT CREATININE: Creatinine, Ser: 1 mg/dL (ref 0.61–1.24)

## 2023-10-10 MED ORDER — IOHEXOL 300 MG/ML  SOLN
125.0000 mL | Freq: Once | INTRAMUSCULAR | Status: AC | PRN
  Administered 2023-10-10: 125 mL via INTRAVENOUS

## 2023-10-20 ENCOUNTER — Telehealth: Payer: Self-pay

## 2023-10-20 NOTE — Telephone Encounter (Signed)
Return call to [facility] to confirm CT report received from 10/10/2023.

## 2023-10-20 NOTE — Progress Notes (Signed)
 Tried to call patient today; left VM requesting call back.  Please let him know: - CT showed no acute findings to explain his recent hematuria. For that his is advised to follow up for cystoscopy on 10/25/2023 as planned for further evaluation.  - CT showed some non-urologic findings which he needs to discuss in the near future with his PCP and his gastroenterologist including: A) borderline splenomegaly. 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 C) A 16 x 9 mm stone in the head of the pancreas.  D) Subtle peripancreatic edema/inflammation is concerning for acute pancreatitis.   Also showed hernias and cirrhosis - to my knowledge he's already aware of those.  If he develops acute abdominal pain, fever, nausea, vomiting, etc. he is instructed to go to the ER for possible acute pancreatitis.

## 2023-10-25 ENCOUNTER — Ambulatory Visit: Payer: Medicare HMO | Admitting: Urology

## 2023-10-25 VITALS — BP 115/66 | HR 66

## 2023-10-25 DIAGNOSIS — N419 Inflammatory disease of prostate, unspecified: Secondary | ICD-10-CM

## 2023-10-25 DIAGNOSIS — Z87442 Personal history of urinary calculi: Secondary | ICD-10-CM | POA: Diagnosis not present

## 2023-10-25 DIAGNOSIS — N411 Chronic prostatitis: Secondary | ICD-10-CM

## 2023-10-25 DIAGNOSIS — N200 Calculus of kidney: Secondary | ICD-10-CM

## 2023-10-25 MED ORDER — DOXYCYCLINE HYCLATE 100 MG PO CAPS: 100.0000 mg | ORAL_CAPSULE | Freq: Two times a day (BID) | ORAL | 0 refills | Status: DC

## 2023-10-25 NOTE — Progress Notes (Signed)
10/25/2023 3:52 PM   Thomas Alexander 08/11/1971 [id number]  Referring provider: Majel Homer, MD [id number] [facility] [address] [place],  [place] [address]  nephrolithiasis   HPI: [name] is a 52yo here for followup for hematuria and nephrolithiasis. He had gross hematuria and worsening urinary urgency and dysuria. His symptoms started 3-4 months. He has intermittent pelvic pain. He denies any stone events since last visit. CT shows no evidence of nephrolithiasis. He denies any flank pain. No stone events since last visit.    PMH: Past Medical History:  Diagnosis Date   Anxiety    Crohn disease (HCC) 2008   Diabetes mellitus without complication (HCC)    Sleep apnea     Surgical History: Past Surgical History:  Procedure Laterality Date   COLECTOMY     x 7 for crohns   COLONOSCOPY     CYSTOSCOPY  2020   CYSTOSCOPY W/ URETERAL STENT REMOVAL  07/29/2021   Procedure: CYSTOSCOPY WITH RIGHT URETERAL STENT REMOVAL;  Surgeon: Malen Gauze, MD;  Location: [facility];  Service: Urology;;   CYSTOSCOPY WITH RETROGRADE PYELOGRAM, URETEROSCOPY AND STENT PLACEMENT Right 07/15/2021   Procedure: CYSTOSCOPY WITH RETROGRADE PYELOGRAM, URETEROSCOPY AND STENT PLACEMENT;  Surgeon: Malen Gauze, MD;  Location: [facility];  Service: Urology;  Laterality: Right;   CYSTOSCOPY WITH RETROGRADE PYELOGRAM, URETEROSCOPY AND STENT PLACEMENT Left 07/29/2021   Procedure: CYSTOSCOPY WITH LEFT RETROGRADE PYELOGRAM, LEFT URETEROSCOPY AND LEFT URETERAL STENT PLACEMENT;  Surgeon: Malen Gauze, MD;  Location: [facility];  Service: Urology;  Laterality: Left;   CYSTOSCOPY/URETEROSCOPY/HOLMIUM LASER/STENT PLACEMENT Right 09/16/2021   Procedure: CYSTOSCOPY/URETEROSCOPY/STENT PLACEMENT;  Surgeon: Malen Gauze, MD;  Location: [facility];  Service: Urology;  Laterality: Right;   HAND SURGERY Left    removal of cyst   HOLMIUM LASER APPLICATION Right 07/15/2021   Procedure: HOLMIUM LASER APPLICATION;  Surgeon:  Malen Gauze, MD;  Location: [facility];  Service: Urology;  Laterality: Right;   HOLMIUM LASER APPLICATION Left 07/29/2021   Procedure: HOLMIUM LASER APPLICATION;  Surgeon: Malen Gauze, MD;  Location: [facility];  Service: Urology;  Laterality: Left;   KNEE SURGERY Left    STONE EXTRACTION WITH BASKET Left 07/29/2021   Procedure: STONE EXTRACTION WITH BASKET;  Surgeon: Malen Gauze, MD;  Location: [facility];  Service: Urology;  Laterality: Left;    Home Medications:  Allergies as of 10/25/2023       Reactions   Nsaids Other (See Comments)   Crohns Disease; takes Humira   Fish Oil Other (See Comments)   Other Reaction: BURNING IN THROAT, STOMACH - pt unaware of this reaction   Sulfamethoxazole-trimethoprim    nausea        Medication List        Accurate as of October 25, 2023  3:52 PM. If you have any questions, ask your nurse or doctor.          atenolol 50 MG tablet Commonly known as: TENORMIN Take 50 mg by mouth daily.   cetirizine 10 MG tablet Commonly known as: ZYRTEC Take 10 mg by mouth daily as needed for allergies.   citalopram 20 MG tablet Commonly known as: CELEXA Take 20 mg by mouth daily.   cyanocobalamin 1000 MCG/ML injection Commonly known as: VITAMIN B12 Inject 1,000 mcg into the muscle every 30 (thirty) days.   cyclobenzaprine 5 MG tablet Commonly known as: FLEXERIL Take 1 tablet (5 mg total) by mouth 3 (three) times daily as needed for muscle spasms.  dicyclomine 10 MG capsule Commonly known as: BENTYL Take 20 mg by mouth 4 (four) times daily -  before meals and at bedtime.   gabapentin 100 MG capsule Commonly known as: NEURONTIN Take 100 mg by mouth at bedtime.   glimepiride 4 MG tablet Commonly known as: AMARYL Take 4 mg by mouth 2 (two) times daily with a meal.   Humira (2 Pen) 40 MG/0.4ML pen Generic drug: adalimumab Inject 40 mg into the skin once a week.   indapamide 2.5 MG tablet Commonly known as: LOZOL Take 1  tablet (2.5 mg total) by mouth daily.   Jardiance 25 MG Tabs tablet Generic drug: empagliflozin Take 25 mg by mouth every morning.   lovastatin 10 MG tablet Commonly known as: MEVACOR Take 10 mg by mouth at bedtime.   magnesium oxide 400 MG tablet Commonly known as: MAG-OX Take 1 tablet (400 mg total) by mouth daily.   mirabegron ER 25 MG Tb24 tablet Commonly known as: MYRBETRIQ Take 1 tablet (25 mg total) by mouth daily.   nitrofurantoin (macrocrystal-monohydrate) 100 MG capsule Commonly known as: MACROBID Take 1 capsule (100 mg total) by mouth every 12 (twelve) hours.   ondansetron 4 MG disintegrating tablet Commonly known as: ZOFRAN-ODT Take 1 tablet (4 mg total) by mouth every 6 (six) hours as needed.   potassium citrate 10 MEQ (1080 MG) SR tablet Commonly known as: UROCIT-K Take 2 tablets (20 mEq total) by mouth in the morning and at bedtime.   sulfamethoxazole-trimethoprim 800-160 MG tablet Commonly known as: BACTRIM DS Take 1 tablet by mouth every 12 (twelve) hours.   tadalafil 20 MG tablet Commonly known as: CIALIS Take 1 tablet (20 mg total) by mouth daily as needed.   tamsulosin 0.4 MG Caps capsule Commonly known as: FLOMAX Take 1 capsule (0.4 mg total) by mouth 2 (two) times daily.   TUMS ULTRA 1000 PO Take 1 tablet by mouth 3 (three) times daily as needed (heartburn).   UltiCare Tuberculin Safety Syr 25G X 5/8" 1 ML Misc Generic drug: TUBERCULIN SYR 1CC/25GX5/8"   Vitamin D (Ergocalciferol) 1.25 MG (50000 UNIT) Caps capsule Commonly known as: DRISDOL Take 50,000 Units by mouth every 7 (seven) days.        Allergies:  Allergies  Allergen Reactions   Nsaids Other (See Comments)    Crohns Disease; takes Humira   Fish Oil Other (See Comments)    Other Reaction: BURNING IN THROAT, STOMACH - pt unaware of this reaction   Sulfamethoxazole-Trimethoprim     nausea     Family History: Family History  Family history unknown: Yes    Social  History:  reports that he has been smoking cigarettes. He does not have any smokeless tobacco history on file. He reports that he does not currently use drugs after having used the following drugs: Marijuana. No history on file for alcohol use.  ROS: All other review of systems were reviewed and are negative except what is noted above in HPI  Physical Exam: BP 115/66   Pulse 66   Constitutional:  Alert and oriented, No acute distress. HEENT: [place] AT, moist mucus membranes.  Trachea midline, no masses. Cardiovascular: No clubbing, cyanosis, or edema. Respiratory: Normal respiratory effort, no increased work of breathing. GI: Abdomen is soft, nontender, nondistended, no abdominal masses GU: No CVA tenderness.  Lymph: No cervical or inguinal lymphadenopathy. Skin: No rashes, bruises or suspicious lesions. Neurologic: Grossly intact, no focal deficits, moving all 4 extremities. Psychiatric: Normal mood and affect.  Laboratory Data: Lab Results  Component Value Date   WBC 5.1 07/13/2021   HGB 14.6 07/13/2021   HCT 42.2 07/13/2021   MCV 99 (H) 07/13/2021   PLT 81 (LL) 07/13/2021    Lab Results  Component Value Date   CREATININE 1.00 10/10/2023    No results found for: "PSA"  Lab Results  Component Value Date   TESTOSTERONE 447 08/16/2022    No results found for: "HGBA1C"  Urinalysis    Component Value Date/Time   APPEARANCEUR Clear 09/05/2023 1141   GLUCOSEU 3+ (A) 09/05/2023 1141   BILIRUBINUR Negative 09/05/2023 1141   PROTEINUR Negative 09/05/2023 1141   NITRITE Negative 09/05/2023 1141   LEUKOCYTESUR 1+ (A) 09/05/2023 1141    Lab Results  Component Value Date   LABMICR See below: 09/05/2023   WBCUA 6-10 (A) 09/05/2023   LABEPIT 0-10 09/05/2023   MUCUS Present 02/11/2022   BACTERIA None seen 09/05/2023    Pertinent Imaging: Ct 10/10/2023: Images reviewed and discussed with the patient  Results for orders placed in visit on 08/09/21  DG Abd 1  View  Narrative CLINICAL DATA:  Kidney stone  EXAM: ABDOMEN - 1 VIEW  COMPARISON:  Knee radiograph dated July 13, 2021  FINDINGS: Normal bowel gas pattern. Decreased size and number of renal stones when compared with prior exam. Largest stone of the right kidney measures 7 mm and is located in the lower pole. Largest stone of the left kidney measures 6 mm and is located in lower pole. Right pelvic calcification is less well seen on today's exam and is also likely decreased in size compared to prior exam.  IMPRESSION: Bilateral nephrolithiasis, overall decreased in size and number when compared with prior exam.   Electronically Signed By: Allegra Lai M.D. On: 08/10/2021 13:20  No results found for this or any previous visit.  No results found for this or any previous visit.  No results found for this or any previous visit.  Results for orders placed during the hospital encounter of 02/01/23  Ultrasound renal complete  Narrative CLINICAL DATA:  Nephrolithiasis follow-up  EXAM: RENAL / URINARY TRACT ULTRASOUND COMPLETE  COMPARISON:  None Available.  FINDINGS: Right Kidney:  Renal measurements: 12.8 x 7.0 x 6.5 cm = volume: 302 mL. Echogenicity within normal limits. No mass or hydronephrosis visualized.  Left Kidney:  Renal measurements: 12.6 x 6.7 x 5.8 cm = volume: 257 mL. Echogenicity within normal limits. No mass or hydronephrosis visualized.  Bladder:  Appears normal for degree of bladder distention.  Other:  None.  IMPRESSION: Normal study. No renal stones identified.   Electronically Signed By: Gerome Sam III M.D. On: 02/03/2023 18:54  No results found for this or any previous visit.  Results for orders placed during the hospital encounter of 10/10/23  CT HEMATURIA WORKUP  Narrative CLINICAL DATA:  Gross hematuria.  Dysuria.  EXAM: CT ABDOMEN AND PELVIS WITHOUT AND WITH CONTRAST  TECHNIQUE: Multidetector CT imaging  of the abdomen and pelvis was performed following the standard protocol before and following the bolus administration of intravenous contrast.  RADIATION DOSE REDUCTION: This exam was performed according to the departmental dose-optimization program which includes automated exposure control, adjustment of the mA and/or kV according to patient size and/or use of iterative reconstruction technique.  CONTRAST:  OMNIPAQUE IOHEXOL 300 MG/ML  SOLN  COMPARISON:  None Available.  FINDINGS: Lower chest: Unremarkable.  Hepatobiliary: Heterogeneous liver parenchyma with marked nodularity of the liver contour is compatible with cirrhosis.  Gallbladder is surgically absent. No intrahepatic or extrahepatic biliary dilation.  Pancreas: Pancreas is atrophic with dilatation of the main duct in the head of the pancreas. 16 x 9 mm stone in the head of the pancreas may be within the distal main pancreatic duct but appears separate from the common bile duct on axial and coronal imaging. Subtle peripancreatic edema/inflammation suggest pancreatitis.  Spleen: Borderline splenomegaly.  Adrenals/Urinary Tract: No adrenal nodule or mass.  Precontrast imaging shows no stones in either kidney or ureter. No bladder stones.  Postcontrast imaging shows no suspicious enhancing mass lesion in either kidney. Tiny well-defined homogeneous low-density lesions in both kidneys are too small to characterize but are statistically most likely benign and probably cysts. No followup imaging is recommended.  Delayed post-contrast imaging shows no wall thickening or soft tissue filling defect in either intrarenal collecting system or renal pelvis. Neither ureter is completely opacified, but there is no evidence for focal hydroureter, ureteral wall thickening, or soft tissue mass lesion along the course of either ureter. Bladder is un opacified. Within this limitation, no focal bladder wall abnormality is  evident.  Stomach/Bowel: Stomach is unremarkable. No gastric wall thickening. No evidence of outlet obstruction. Duodenum is normally positioned as is the ligament of Treitz. No small bowel wall thickening. No small bowel dilatation. Status post right hemicolectomy moderate stool volume.  Vascular/Lymphatic: There is mild atherosclerotic calcification of the abdominal aorta without aneurysm. Portal vein and superior mesenteric vein are patent. Splenic vein is patent. There is no gastrohepatic or hepatoduodenal ligament lymphadenopathy. No retroperitoneal or mesenteric lymphadenopathy. No pelvic sidewall lymphadenopathy.  Reproductive: The prostate gland and seminal vesicles are unremarkable.  Other: No intraperitoneal free fluid.  Musculoskeletal: No worrisome lytic or sclerotic osseous abnormality. 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 Hernia sacs contain omental fat but no bowel.  IMPRESSION: 1. No urinary tract stones, hydronephrosis, or suspicious enhancing mass lesion in either kidney or ureter. No findings to explain the patient's history of hematuria. 2. Heterogeneous liver parenchyma with marked nodularity of the liver contour compatible with cirrhosis. No focal liver lesion by CT. 3. Borderline splenomegaly. 4. Pancreas is atrophic with dilatation of the main duct in the head of the pancreas. 16 x 9 mm stone in the head of the pancreas may be within the distal main pancreatic duct and appears separate from the common bile duct on axial and coronal imaging. Subtle peripancreatic edema/inflammation is concerning for acute pancreatitis. 5. 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 Hernia sacs contain omental fat but no bowel. 6.  Aortic Atherosclerosis (ICD10-I70.0).  These results will be called to the ordering clinician or representative by the Radiologist Assistant, and communication documented in the PACS  or [employer].   Electronically Signed By: Kennith Center M.D. On: 10/20/2023 07:04  No results found for this or any previous visit.   Assessment & Plan:    1. Kidney stones (Primary) Followup 6 months with a renal US - Urinalysis, Routine w reflex microscopic  2. Prostatitis -doxycycline 100mg  BID for 28 days  No follow-ups on file.  Wilkie Aye, MD  [facility] [place]

## 2023-10-26 ENCOUNTER — Other Ambulatory Visit: Payer: Medicare HMO | Admitting: Urology

## 2023-10-26 LAB — MICROSCOPIC EXAMINATION: Bacteria, UA: NONE SEEN

## 2023-10-26 LAB — URINALYSIS, ROUTINE W REFLEX MICROSCOPIC
Bilirubin, UA: NEGATIVE
Ketones, UA: NEGATIVE
Nitrite, UA: NEGATIVE
Protein,UA: NEGATIVE
Specific Gravity, UA: 1.01 (ref 1.005–1.030)
Urobilinogen, Ur: 2 mg/dL — ABNORMAL HIGH (ref 0.2–1.0)
pH, UA: 6.5 (ref 5.0–7.5)

## 2023-10-27 ENCOUNTER — Encounter: Payer: Self-pay | Admitting: Urology

## 2023-10-27 NOTE — Patient Instructions (Signed)
Prostatitis  Prostatitis is swelling of the prostate gland, also called the prostate. This gland is about 1.5 inches wide and 1 inch high, and it helps to make a fluid called semen. The prostate is below a man's bladder, in front of the butt (rectum). There are different types of prostatitis. What are the causes? One type of prostatitis is caused by an infection from germs (bacteria). Another type is not caused by germs. It may be caused by: Things having to do with the nervous system. This system includes thebrain, spinal cord, and nerves. An autoimmune response. This happens when the body's disease-fighting system attacks healthy tissue in the body by mistake. Psychological factors. These have to do with how the mind works. The causes of other types of prostatitis are normally not known. What are the signs or symptoms? Symptoms of this condition depend on the type of prostatitis you have. If your condition is caused by germs: You may feel pain or burning when you pee (urinate). You may pee often and all of a sudden. You may have problems starting to pee. You may have trouble emptying your bladder when you pee. You may have fever or chills. You may feel pain in your muscles, joints, low back, or lower belly. If you have other types of prostatitis: You may pee often or all of a sudden. You may have trouble starting to pee. You may have a weak flow when you pee. You may leak pee after using the bathroom. You may have other problems, such as: Abnormal fluid coming from the penis. Pain in the testicles or penis. Pain between the butt and the testicles. Pain when fluid comes out of the penis during sex. How is this treated? Treatment for this condition depends on the type of prostatitis. Treatment may include: Medicines. These may treat pain or swelling, or they may help relax muscles. Exercises to help you move better or get stronger (physical therapy). Heat therapy. Techniques to help  you control some of the ways that your body works. Exercises to help you relax. Antibiotic medicine, if your condition is caused by germs. Warm water baths (sitz baths) to relax muscles. Follow these instructions at home: Medicines Take over-the-counter and prescription medicines only as told by your doctor. If you were prescribed an antibiotic medicine, take it as told by your doctor. Do not stop using the antibiotic even if you start to feel better. Managing pain and swelling  Take sitz baths as told by your doctor. For a sitz bath, sit in warm water that is deep enough to cover your hips and butt. If told, put heat on the painful area. Do this as often as told by your doctor. Use the heat source that your doctor recommends, such as a moist heat pack or a heating pad. Place a towel between your skin and the heat source. Leave the heat on for 20-30 minutes. Take off the heat if your skin turns bright red. This is very important if you are unable to feel pain, heat, or cold. You may have a greater risk of getting burned. General instructions Do exercises as told by your doctor, if your doctor prescribed them. Keep all follow-up visits as told by your doctor. This is important. Where to find more information National Institute of Diabetes and Digestive and Kidney Diseases: https://www.niddk.nih.gov Contact a doctor if: Your symptoms get worse. You have a fever. Get help right away if: You have chills. You feel light-headed. You feel like you may   faint. You cannot pee. You have blood or clumps of blood (blood clots) in your pee. Summary Prostatitis is swelling of the prostate gland. There are different types of prostatitis. Treatment depends on the type that you have. Take over-the-counter and prescription medicines only as told by your doctor. Get help right away of you have chills, feel light-headed, or feel like you may faint. Also get help right away if you cannot pee or you have  blood or clumps of blood in your pee. This information is not intended to replace advice given to you by your health care provider. Make sure you discuss any questions you have with your health care provider. Document Revised: 07/28/2022 Document Reviewed: 07/28/2022 Elsevier Patient Education  2024 [employer].  

## 2023-10-30 ENCOUNTER — Telehealth: Payer: Self-pay | Admitting: Urology

## 2023-10-30 NOTE — Telephone Encounter (Signed)
Antibiotic is not agreeing with him. It gives him bad acid reflux. Wants to know if he can get something different.

## 2023-10-30 NOTE — Telephone Encounter (Signed)
Patient states despite taking medication with food he had acid reflux, vomiting, upset stomach, and the feeling of food getting stuck in his throat. Patient advised to stop doxycyline.

## 2023-10-31 MED ORDER — CIPROFLOXACIN HCL 500 MG PO TABS: 500.0000 mg | ORAL_TABLET | Freq: Two times a day (BID) | ORAL | 0 refills | Status: AC

## 2023-10-31 NOTE — Telephone Encounter (Signed)
 Rx sent. Patient called and made aware.

## 2023-11-27 ENCOUNTER — Telehealth: Payer: Self-pay | Admitting: Urology

## 2023-11-27 NOTE — Telephone Encounter (Signed)
 Patient was seen in ER on Saturday 11/25/23 he was put on antibiotics for 2 weeks and prednisone , does he need to get a earlier appt than 12/29/23? Does he need to finish the medication they gave him or do you want to start him on something different?

## 2023-12-05 NOTE — Telephone Encounter (Signed)
 Patient called and made aware.

## 2023-12-29 ENCOUNTER — Ambulatory Visit: Payer: Medicare HMO | Admitting: Urology

## 2023-12-29 VITALS — BP 109/65 | HR 68

## 2023-12-29 DIAGNOSIS — N200 Calculus of kidney: Secondary | ICD-10-CM

## 2023-12-29 DIAGNOSIS — N411 Chronic prostatitis: Secondary | ICD-10-CM

## 2023-12-29 DIAGNOSIS — R350 Frequency of micturition: Secondary | ICD-10-CM

## 2023-12-29 LAB — URINALYSIS, ROUTINE W REFLEX MICROSCOPIC
Bilirubin, UA: NEGATIVE
Ketones, UA: NEGATIVE
Nitrite, UA: NEGATIVE
Protein,UA: NEGATIVE
Specific Gravity, UA: 1.01 (ref 1.005–1.030)
Urobilinogen, Ur: 1 mg/dL (ref 0.2–1.0)
pH, UA: 6.5 (ref 5.0–7.5)

## 2023-12-29 LAB — MICROSCOPIC EXAMINATION: RBC, Urine: >30 /HPF — AB (ref 0–2)

## 2023-12-29 LAB — BLADDER SCAN AMB NON-IMAGING: Scan Result: 76

## 2023-12-29 MED ORDER — TAMSULOSIN HCL 0.4 MG PO CAPS: 0.4000 mg | ORAL_CAPSULE | Freq: Two times a day (BID) | ORAL | 11 refills | Status: DC

## 2023-12-29 MED ORDER — CEFUROXIME AXETIL 500 MG PO TABS: 500.0000 mg | ORAL_TABLET | Freq: Two times a day (BID) | ORAL | 0 refills | Status: AC

## 2023-12-29 NOTE — Progress Notes (Signed)
post void residual=76 

## 2023-12-29 NOTE — Progress Notes (Signed)
 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 He was seen in the ER 3 weeks ago and diagnosed with a UTI. He remains on jardiance. He has had 3 UTIs in the past 3 months.    PMH: Past Medical History:  Diagnosis Date   Anxiety    Crohn disease (HCC) 2008   Diabetes mellitus without complication (HCC)    Sleep apnea     Surgical History: Past Surgical History:  Procedure Laterality Date   COLECTOMY     x 7 for crohns   COLONOSCOPY     CYSTOSCOPY  2020   CYSTOSCOPY W/ URETERAL STENT REMOVAL  07/29/2021   Procedure: CYSTOSCOPY WITH RIGHT URETERAL STENT REMOVAL;  Surgeon: Marco Severs, MD;  Location: [facility];  Service: Urology;;   CYSTOSCOPY WITH RETROGRADE PYELOGRAM, URETEROSCOPY AND STENT PLACEMENT Right 07/15/2021   Procedure: CYSTOSCOPY WITH RETROGRADE PYELOGRAM, URETEROSCOPY AND STENT PLACEMENT;  Surgeon: Marco Severs, MD;  Location: [facility];  Service: Urology;  Laterality: Right;   CYSTOSCOPY WITH RETROGRADE PYELOGRAM, URETEROSCOPY AND STENT PLACEMENT Left 07/29/2021   Procedure: CYSTOSCOPY WITH LEFT RETROGRADE PYELOGRAM, LEFT URETEROSCOPY AND LEFT URETERAL STENT PLACEMENT;  Surgeon: Marco Severs, MD;  Location: [facility];  Service: Urology;  Laterality: Left;   CYSTOSCOPY/URETEROSCOPY/HOLMIUM LASER/STENT PLACEMENT Right 09/16/2021   Procedure: CYSTOSCOPY/URETEROSCOPY/STENT PLACEMENT;  Surgeon: Marco Severs, MD;  Location: [facility];  Service: Urology;  Laterality: Right;   HAND SURGERY Left    removal of cyst   HOLMIUM LASER APPLICATION Right 07/15/2021   Procedure: HOLMIUM LASER APPLICATION;  Surgeon: Marco Severs, MD;  Location: [facility];  Service: Urology;  Laterality: Right;   HOLMIUM LASER APPLICATION Left  07/29/2021   Procedure: HOLMIUM LASER APPLICATION;  Surgeon: Marco Severs, MD;  Location: [facility];  Service: Urology;  Laterality: Left;   KNEE SURGERY Left    STONE EXTRACTION WITH BASKET Left 07/29/2021   Procedure: STONE EXTRACTION WITH BASKET;  Surgeon: Marco Severs, MD;  Location: [facility];  Service: Urology;  Laterality: Left;    Home Medications:  Allergies as of 12/29/2023       Reactions   Nsaids Other (See Comments)   Crohns Disease; takes Humira   Fish Oil Other (See Comments)   Other Reaction: BURNING IN THROAT, STOMACH - pt unaware of this reaction   Sulfamethoxazole -trimethoprim     nausea        Medication List        Accurate as of December 29, 2023 10:17 AM. If you have any questions, ask your nurse or doctor.          atenolol 50 MG tablet Commonly known as: TENORMIN Take 50 mg by mouth daily.   cetirizine 10 MG tablet Commonly known as: ZYRTEC Take 10 mg by mouth daily as needed for allergies.   citalopram 20 MG tablet Commonly known as: CELEXA Take 20 mg by mouth daily.   cyanocobalamin 1000 MCG/ML injection Commonly known as: VITAMIN B12 Inject 1,000 mcg into the muscle every 30 (thirty) days.   cyclobenzaprine  5 MG tablet Commonly known as: FLEXERIL  Take 1 tablet (5 mg total) by mouth 3 (three) times daily as needed for muscle spasms.   dicyclomine 10 MG capsule Commonly known as: BENTYL Take 20 mg by  mouth 4 (four) times daily -  before meals and at bedtime.   doxycycline  100 MG capsule Commonly known as: VIBRAMYCIN  Take 1 capsule (100 mg total) by mouth every 12 (twelve) hours.   gabapentin 100 MG capsule Commonly known as: NEURONTIN Take 100 mg by mouth at bedtime.   glimepiride 4 MG tablet Commonly known as: AMARYL Take 4 mg by mouth 2 (two) times daily with a meal.   Humira (2 Pen) 40 MG/0.4ML pen Generic drug: adalimumab Inject 40 mg into the skin once a week.   indapamide  2.5 MG tablet Commonly known as:  LOZOL  Take 1 tablet (2.5 mg total) by mouth daily.   Jardiance 25 MG Tabs tablet Generic drug: empagliflozin Take 25 mg by mouth every morning.   lovastatin 10 MG tablet Commonly known as: MEVACOR Take 10 mg by mouth at bedtime.   magnesium  oxide 400 MG tablet Commonly known as: MAG-OX Take 1 tablet (400 mg total) by mouth daily.   mirabegron  ER 25 MG Tb24 tablet Commonly known as: MYRBETRIQ  Take 1 tablet (25 mg total) by mouth daily.   nitrofurantoin  (macrocrystal-monohydrate) 100 MG capsule Commonly known as: MACROBID  Take 1 capsule (100 mg total) by mouth every 12 (twelve) hours.   ondansetron  4 MG disintegrating tablet Commonly known as: ZOFRAN -ODT Take 1 tablet (4 mg total) by mouth every 6 (six) hours as needed.   potassium citrate  10 MEQ (1080 MG) SR tablet Commonly known as: UROCIT-K  Take 2 tablets (20 mEq total) by mouth in the morning and at bedtime.   sulfamethoxazole -trimethoprim  800-160 MG tablet Commonly known as: BACTRIM  DS Take 1 tablet by mouth every 12 (twelve) hours.   tadalafil  20 MG tablet Commonly known as: CIALIS  Take 1 tablet (20 mg total) by mouth daily as needed.   tamsulosin  0.4 MG Caps capsule Commonly known as: FLOMAX  Take 1 capsule (0.4 mg total) by mouth 2 (two) times daily.   TUMS ULTRA 1000 PO Take 1 tablet by mouth 3 (three) times daily as needed (heartburn).   UltiCare Tuberculin Safety Syr 25G X 5/8" 1 ML Misc Generic drug: TUBERCULIN SYR 1CC/25GX5/8"   Vitamin D (Ergocalciferol) 1.25 MG (50000 UNIT) Caps capsule Commonly known as: DRISDOL Take 50,000 Units by mouth every 7 (seven) days.        Allergies:  Allergies  Allergen Reactions   Nsaids Other (See Comments)    Crohns Disease; takes Humira   Fish Oil Other (See Comments)    Other Reaction: BURNING IN THROAT, STOMACH - pt unaware of this reaction   Sulfamethoxazole -Trimethoprim      nausea     Family History: Family History  Family history unknown: Yes     Social History:  reports that he has been smoking cigarettes. He does not have any smokeless tobacco history on file. He reports that he does not currently use drugs after having used the following drugs: Marijuana. No history on file for alcohol use.  ROS: All other review of systems were reviewed and are negative except what is noted above in HPI  Physical Exam: BP 109/65   Pulse 68   Constitutional:  Alert and oriented, No acute distress. HEENT: [place] AT, moist mucus membranes.  Trachea midline, no masses. Cardiovascular: No clubbing, cyanosis, or edema. Respiratory: Normal respiratory effort, no increased work of breathing. GI: Abdomen is soft, nontender, nondistended, no abdominal masses GU: No CVA tenderness.  Lymph: No cervical or inguinal lymphadenopathy. Skin: No rashes, bruises or suspicious lesions. Neurologic: Grossly intact, no focal deficits,  moving all 4 extremities. Psychiatric: Normal mood and affect.  Laboratory Data: Lab Results  Component Value Date   WBC 5.1 07/13/2021   HGB 14.6 07/13/2021   HCT 42.2 07/13/2021   MCV 99 (H) 07/13/2021   PLT 81 (LL) 07/13/2021    Lab Results  Component Value Date   CREATININE 1.00 10/10/2023    No results found for: "PSA"  Lab Results  Component Value Date   TESTOSTERONE  447 08/16/2022    No results found for: "HGBA1C"  Urinalysis    Component Value Date/Time   APPEARANCEUR Clear 10/25/2023 1528   GLUCOSEU 3+ (A) 10/25/2023 1528   BILIRUBINUR Negative 10/25/2023 1528   PROTEINUR Negative 10/25/2023 1528   NITRITE Negative 10/25/2023 1528   LEUKOCYTESUR 1+ (A) 10/25/2023 1528    Lab Results  Component Value Date   LABMICR See below: 10/25/2023   WBCUA 6-10 (A) 10/25/2023   LABEPIT 0-10 10/25/2023   MUCUS Present 02/11/2022   BACTERIA None seen 10/25/2023    Pertinent Imaging:  Results for orders placed in visit on 08/09/21  DG Abd 1 View  Narrative CLINICAL DATA:  Kidney  stone  EXAM: ABDOMEN - 1 VIEW  COMPARISON:  Knee radiograph dated July 13, 2021  FINDINGS: Normal bowel gas pattern. Decreased size and number of renal stones when compared with prior exam. Largest stone of the right kidney measures 7 mm and is located in the lower pole. Largest stone of the left kidney measures 6 mm and is located in lower pole. Right pelvic calcification is less well seen on today's exam and is also likely decreased in size compared to prior exam.  IMPRESSION: Bilateral nephrolithiasis, overall decreased in size and number when compared with prior exam.   Electronically Signed By: Avelino Lek M.D. On: 08/10/2021 13:20  No results found for this or any previous visit.  No results found for this or any previous visit.  No results found for this or any previous visit.  Results for orders placed during the hospital encounter of 02/01/23  Ultrasound renal complete  Narrative CLINICAL DATA:  Nephrolithiasis follow-up  EXAM: RENAL / URINARY TRACT ULTRASOUND COMPLETE  COMPARISON:  None Available.  FINDINGS: Right Kidney:  Renal measurements: 12.8 x 7.0 x 6.5 cm = volume: 302 mL. Echogenicity within normal limits. No mass or hydronephrosis visualized.  Left Kidney:  Renal measurements: 12.6 x 6.7 x 5.8 cm = volume: 257 mL. Echogenicity within normal limits. No mass or hydronephrosis visualized.  Bladder:  Appears normal for degree of bladder distention.  Other:  None.  IMPRESSION: Normal study. No renal stones identified.   Electronically Signed By: Lorrene Rosser III M.D. On: 02/03/2023 18:54  No results found for this or any previous visit.  Results for orders placed during the hospital encounter of 10/10/23  CT HEMATURIA WORKUP  Narrative CLINICAL DATA:  Gross hematuria.  Dysuria.  EXAM: CT ABDOMEN AND PELVIS WITHOUT AND WITH CONTRAST  TECHNIQUE: Multidetector CT imaging of the abdomen and pelvis was  performed following the standard protocol before and following the bolus administration of intravenous contrast.  RADIATION DOSE REDUCTION: This exam was performed according to the departmental dose-optimization program which includes automated exposure control, adjustment of the mA and/or kV according to patient size and/or use of iterative reconstruction technique.  CONTRAST:  OMNIPAQUE  IOHEXOL  300 MG/ML  SOLN  COMPARISON:  None Available.  FINDINGS: Lower chest: Unremarkable.  Hepatobiliary: Heterogeneous liver parenchyma with marked nodularity of the liver contour is compatible with  cirrhosis. Gallbladder is surgically absent. No intrahepatic or extrahepatic biliary dilation.  Pancreas: Pancreas is atrophic with dilatation of the main duct in the head of the pancreas. 16 x 9 mm stone in the head of the pancreas may be within the distal main pancreatic duct but appears separate from the common bile duct on axial and coronal imaging. Subtle peripancreatic edema/inflammation suggest pancreatitis.  Spleen: Borderline splenomegaly.  Adrenals/Urinary Tract: No adrenal nodule or mass.  Precontrast imaging shows no stones in either kidney or ureter. No bladder stones.  Postcontrast imaging shows no suspicious enhancing mass lesion in either kidney. Tiny well-defined homogeneous low-density lesions in both kidneys are too small to characterize but are statistically most likely benign and probably cysts. No followup imaging is recommended.  Delayed post-contrast imaging shows no wall thickening or soft tissue filling defect in either intrarenal collecting system or renal pelvis. Neither ureter is completely opacified, but there is no evidence for focal hydroureter, ureteral wall thickening, or soft tissue mass lesion along the course of either ureter. Bladder is un opacified. Within this limitation, no focal bladder wall abnormality is evident.  Stomach/Bowel: Stomach  is unremarkable. No gastric wall thickening. No evidence of outlet obstruction. Duodenum is normally positioned as is the ligament of Treitz. No small bowel wall thickening. No small bowel dilatation. Status post right hemicolectomy moderate stool volume.  Vascular/Lymphatic: There is mild atherosclerotic calcification of the abdominal aorta without aneurysm. Portal vein and superior mesenteric vein are patent. Splenic vein is patent. There is no gastrohepatic or hepatoduodenal ligament lymphadenopathy. No retroperitoneal or mesenteric lymphadenopathy. No pelvic sidewall lymphadenopathy.  Reproductive: The prostate gland and seminal vesicles are unremarkable.  Other: No intraperitoneal free fluid.  Musculoskeletal: No worrisome lytic or sclerotic osseous abnormality. 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 Hernia sacs contain omental fat but no bowel.  IMPRESSION: 1. No urinary tract stones, hydronephrosis, or suspicious enhancing mass lesion in either kidney or ureter. No findings to explain the patient's history of hematuria. 2. Heterogeneous liver parenchyma with marked nodularity of the liver contour compatible with cirrhosis. No focal liver lesion by CT. 3. Borderline splenomegaly. 4. Pancreas is atrophic with dilatation of the main duct in the head of the pancreas. 16 x 9 mm stone in the head of the pancreas may be within the distal main pancreatic duct and appears separate from the common bile duct on axial and coronal imaging. Subtle peripancreatic edema/inflammation is concerning for acute pancreatitis. 5. 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 Hernia sacs contain omental fat but no bowel. 6.  Aortic Atherosclerosis (ICD10-I70.0).  These results will be called to the ordering clinician or representative by the Radiologist Assistant, and communication documented in the PACS or [employer].   Electronically Signed By: Donnal Fusi M.D. On: 10/20/2023 07:04  No results found for this or any previous visit.   Assessment & Plan:    1. Kidney stones (Primary) Followuop 6 months with renal US  - Urinalysis, Routine w reflex microscopic  2. Urinary frequency Continue flomax  BID - BLADDER SCAN AMB NON-IMAGING  3. Chronic prostatitis without hematuria Urine for culture -ceftin  500mg  BID for 7 days -Patient instructed to contact PCP to get an alternative to jardiance as this medication is causing his recurrent infections.    No follow-ups on file.  Johnie Nailer, MD  [facility] [place]

## 2023-12-31 LAB — URINE CULTURE

## 2024-01-04 IMAGING — US US RENAL
1 series · 14 of 25 positions shown · non-contrast
Comparison: Renal ultrasound dated 08/13/2021.

CLINICAL DATA: Kidney stone.

EXAM:
RENAL / URINARY TRACT ULTRASOUND COMPLETE

[Series 1: us renal · 14 of 46 slices shown]
[im 1/46]
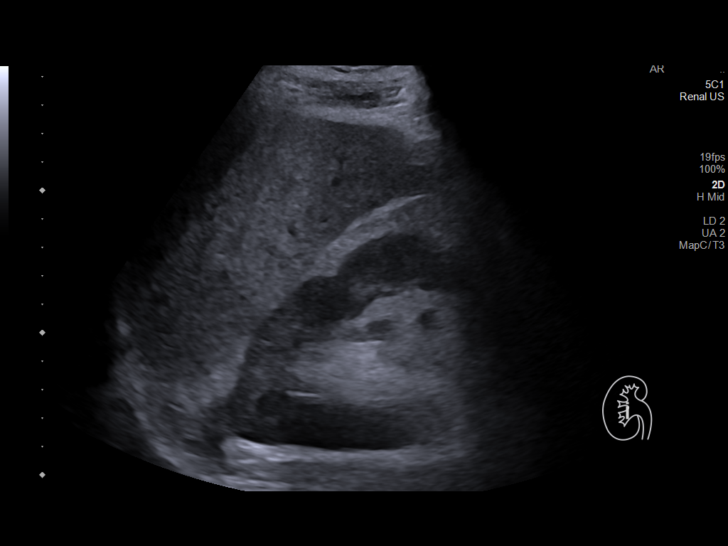
[im 4/46]
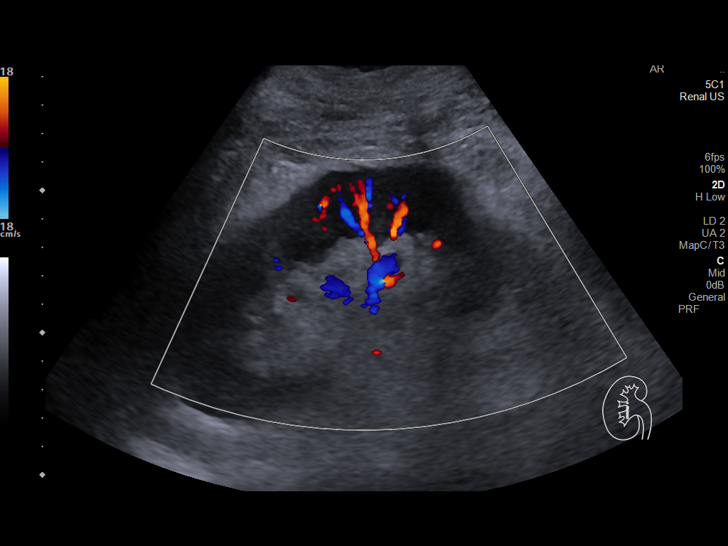
[im 8/46]
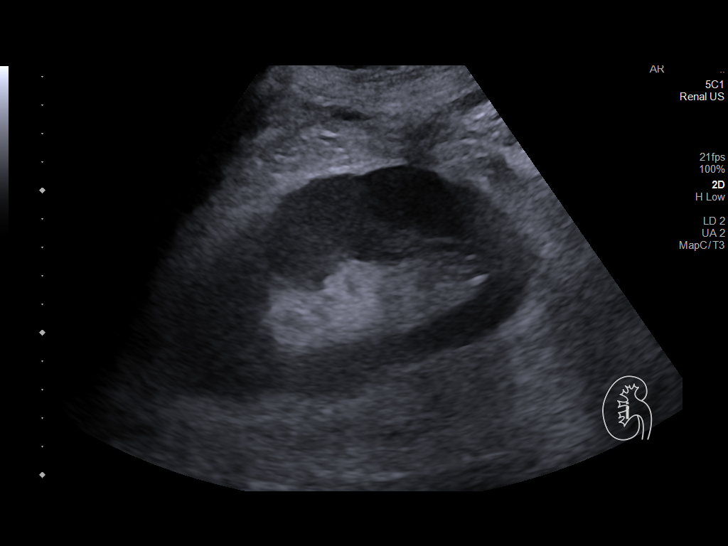
[im 12/46]
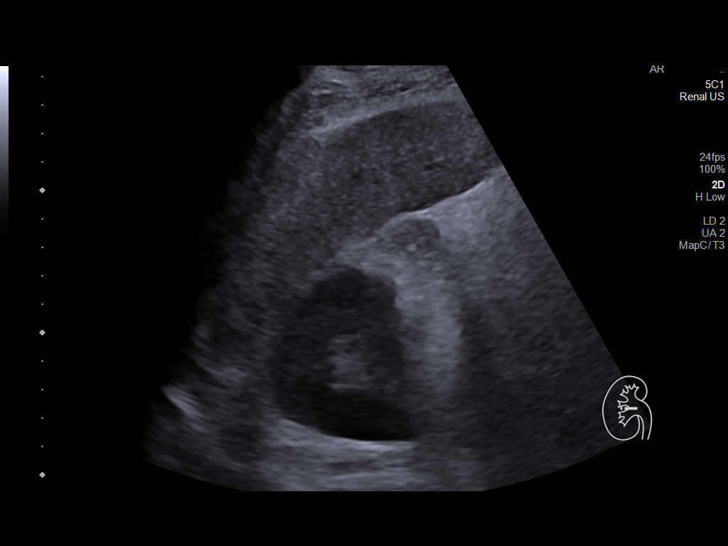
[im 16/46]
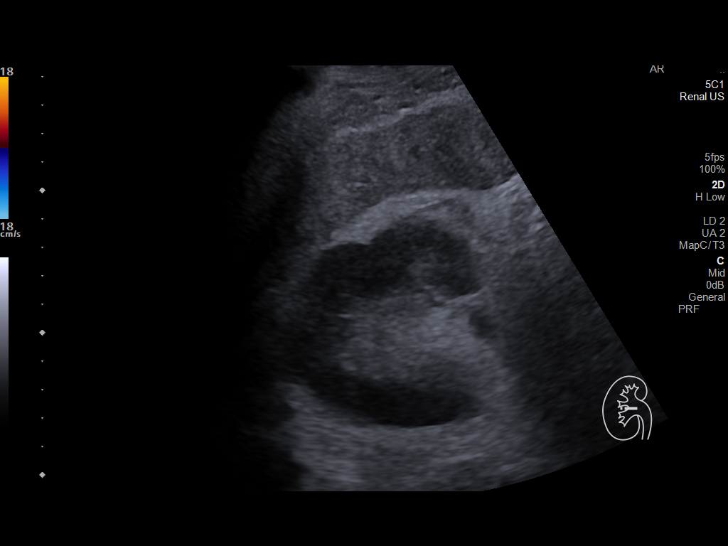
[im 17/46]
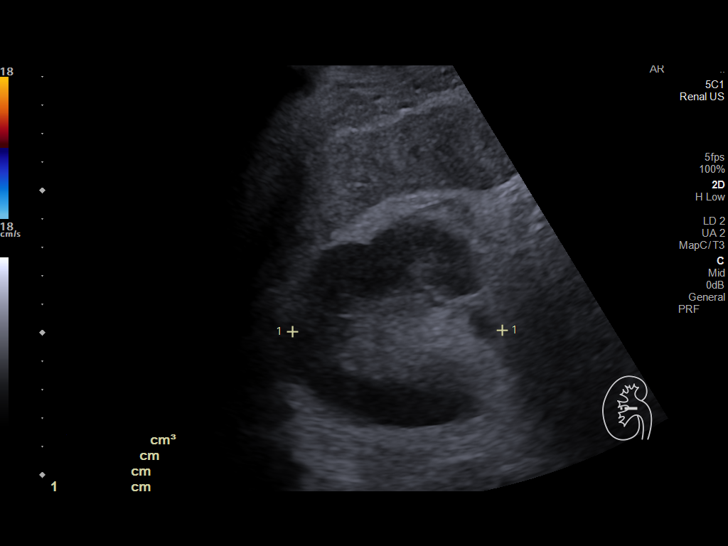
[im 21/46]
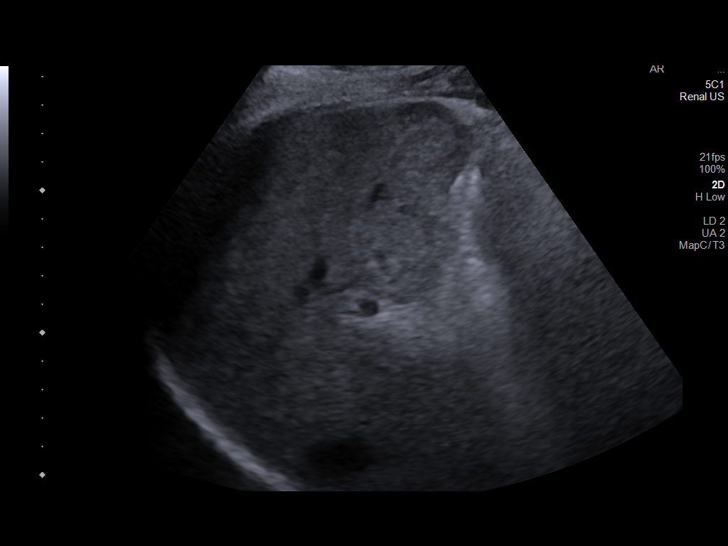
[im 25/46]
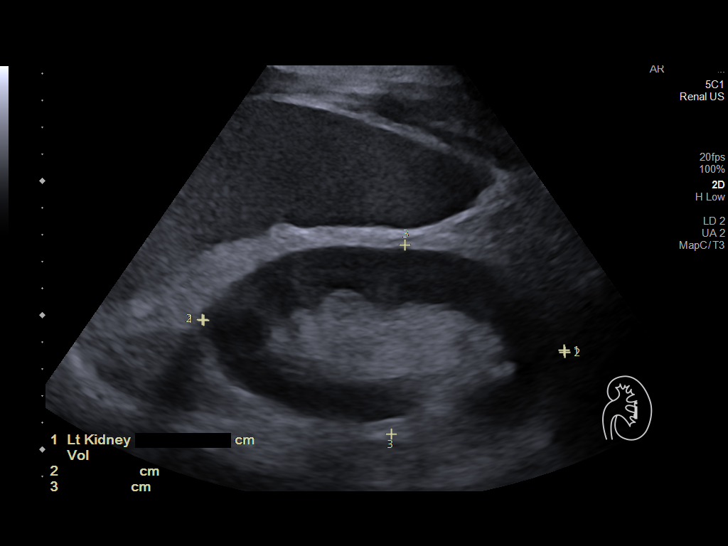
[im 29/46]
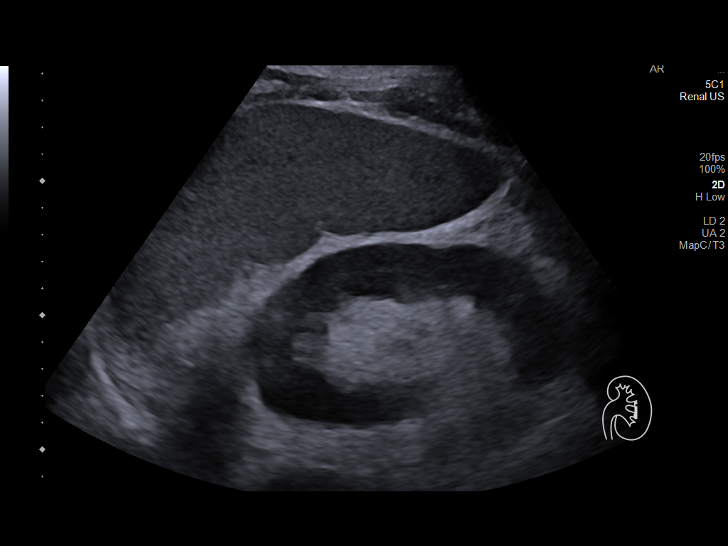
[im 31/46]
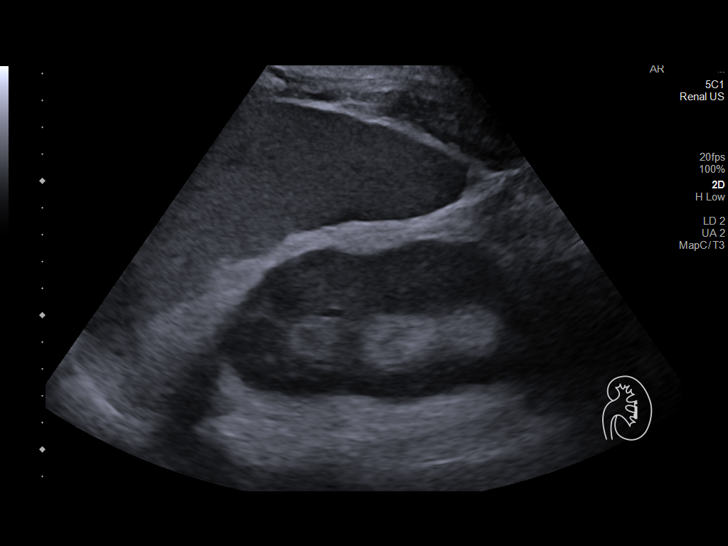
[im 34/46]
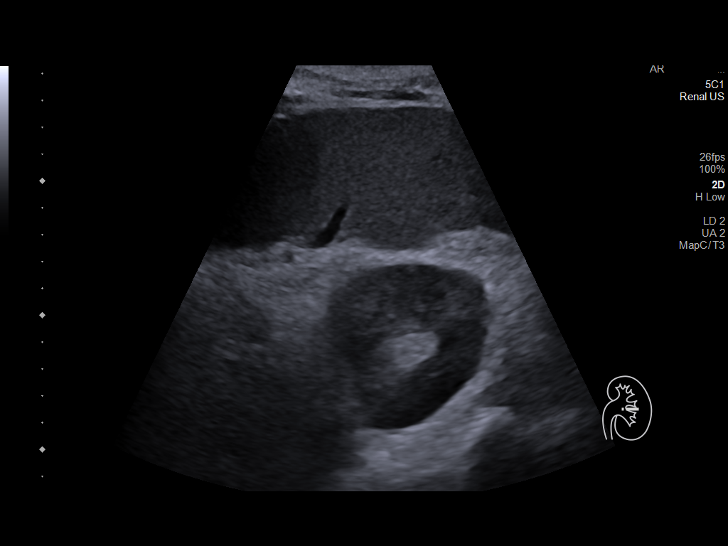
[im 38/46]
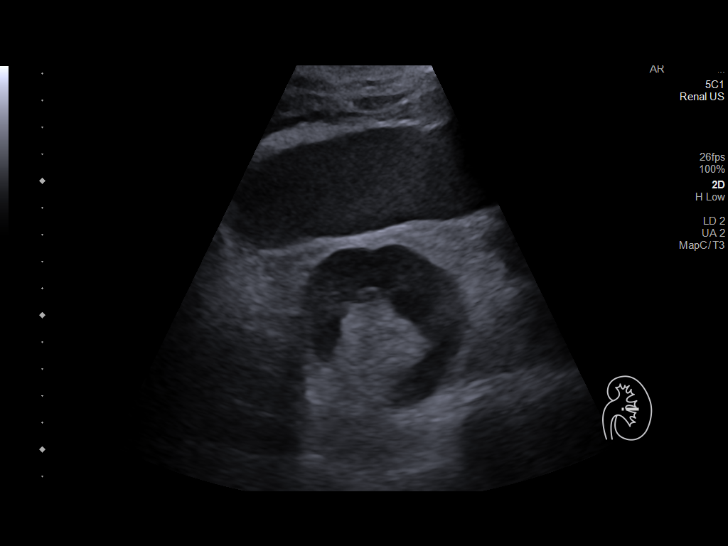
[im 42/46]
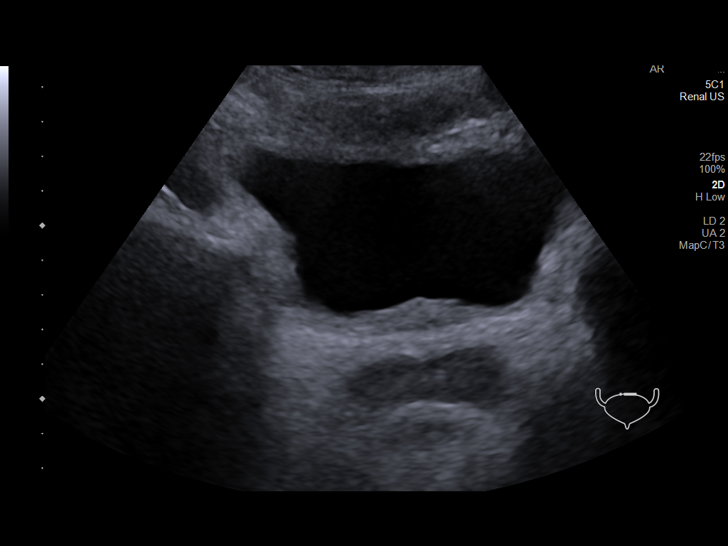
[im 46/46]
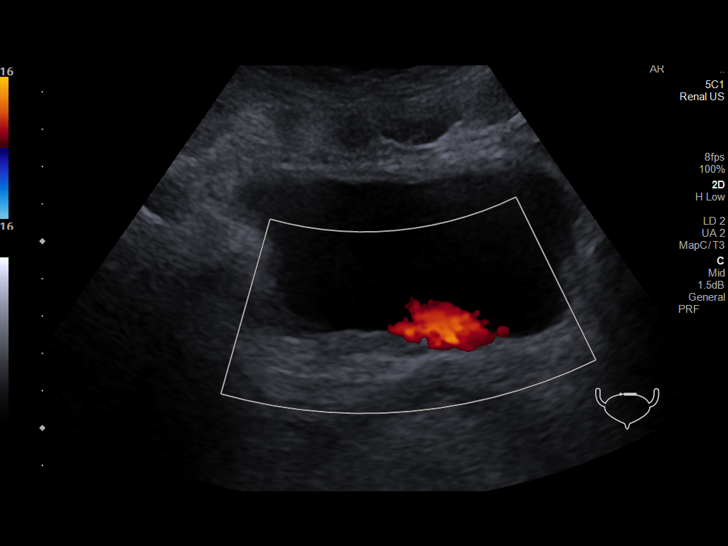

[14 of 25 positions shown; findings below may reference images not displayed]

FINDINGS: Right Kidney:

Renal measurements: 13.8 x 8.2 x 7.4 cm = volume: 45 mL. Normal
echogenicity. No hydronephrosis or shadowing stone.

Left Kidney:

Renal measurements: 13.6 x 7.1 x 7.2 cm = volume: 362 ML. Normal
echogenicity. No hydronephrosis or shadowing stone.

Bladder:

Appears normal for degree of bladder distention. Bilateral ureteral
jets noted.

Other:

There is diffuse increased liver echogenicity most commonly seen in
the setting of fatty infiltration. Superimposed inflammation or
fibrosis is not excluded. Clinical correlation is recommended.
IMPRESSION: 1. Unremarkable kidneys and urinary bladder.
2. Fatty liver.

## 2024-01-14 ENCOUNTER — Encounter: Payer: Self-pay | Admitting: Urology

## 2024-01-14 NOTE — Patient Instructions (Signed)
 Prostatitis  Prostatitis is swelling of the prostate gland, also called the prostate. This gland is about 1.5 inches wide and 1 inch high, and it helps to make a fluid called semen. The prostate is below a man's bladder, in front of the butt (rectum). There are different types of prostatitis. What are the causes? One type of prostatitis is caused by an infection from germs (bacteria). Another type is not caused by germs. It may be caused by: Things having to do with the nervous system. This system includes thebrain, spinal cord, and nerves. An autoimmune response. This happens when the body's disease-fighting system attacks healthy tissue in the body by mistake. Psychological factors. These have to do with how the mind works. The causes of other types of prostatitis are normally not known. What are the signs or symptoms? Symptoms of this condition depend on the type of prostatitis you have. If your condition is caused by germs: You may feel pain or burning when you pee (urinate). You may pee often and all of a sudden. You may have problems starting to pee. You may have trouble emptying your bladder when you pee. You may have fever or chills. You may feel pain in your muscles, joints, low back, or lower belly. If you have other types of prostatitis: You may pee often or all of a sudden. You may have trouble starting to pee. You may have a weak flow when you pee. You may leak pee after using the bathroom. You may have other problems, such as: Abnormal fluid coming from the penis. Pain in the testicles or penis. Pain between the butt and the testicles. Pain when fluid comes out of the penis during sex. How is this treated? Treatment for this condition depends on the type of prostatitis. Treatment may include: Medicines. These may treat pain or swelling, or they may help relax muscles. Exercises to help you move better or get stronger (physical therapy). Heat therapy. Techniques to help  you control some of the ways that your body works. Exercises to help you relax. Antibiotic medicine, if your condition is caused by germs. Warm water baths (sitz baths) to relax muscles. Follow these instructions at home: Medicines Take over-the-counter and prescription medicines only as told by your doctor. If you were prescribed an antibiotic medicine, take it as told by your doctor. Do not stop using the antibiotic even if you start to feel better. Managing pain and swelling  Take sitz baths as told by your doctor. For a sitz bath, sit in warm water that is deep enough to cover your hips and butt. If told, put heat on the painful area. Do this as often as told by your doctor. Use the heat source that your doctor recommends, such as a moist heat pack or a heating pad. Place a towel between your skin and the heat source. Leave the heat on for 20-30 minutes. Take off the heat if your skin turns bright red. This is very important if you are unable to feel pain, heat, or cold. You may have a greater risk of getting burned. General instructions Do exercises as told by your doctor, if your doctor prescribed them. Keep all follow-up visits as told by your doctor. This is important. Where to find more information [employer] of Diabetes and Digestive and Kidney Diseases: [employer] Contact a doctor if: Your symptoms get worse. You have a fever. Get help right away if: You have chills. You feel light-headed. You feel like you may  faint. You cannot pee. You have blood or clumps of blood (blood clots) in your pee. Summary Prostatitis is swelling of the prostate gland. There are different types of prostatitis. Treatment depends on the type that you have. Take over-the-counter and prescription medicines only as told by your doctor. Get help right away of you have chills, feel light-headed, or feel like you may faint. Also get help right away if you cannot pee or you have  blood or clumps of blood in your pee. This information is not intended to replace advice given to you by your health care provider. Make sure you discuss any questions you have with your health care provider. Document Revised: 07/28/2022 Document Reviewed: 07/28/2022 Elsevier Patient Education  2024 [employer].

## 2024-01-22 ENCOUNTER — Ambulatory Visit (HOSPITAL_COMMUNITY)
Admission: RE | Admit: 2024-01-22 | Discharge: 2024-01-22 | Disposition: A | Discharge status: Home / Self Care | Source: Ambulatory Visit | Attending: Urology | Admitting: Urology

## 2024-01-22 DIAGNOSIS — N200 Calculus of kidney: Secondary | ICD-10-CM | POA: Diagnosis present

## 2024-02-02 ENCOUNTER — Ambulatory Visit: Payer: Medicare HMO | Admitting: Urology

## 2024-02-02 VITALS — BP 105/63 | HR 57

## 2024-02-02 DIAGNOSIS — Z87442 Personal history of urinary calculi: Secondary | ICD-10-CM | POA: Diagnosis not present

## 2024-02-02 DIAGNOSIS — R350 Frequency of micturition: Secondary | ICD-10-CM

## 2024-02-02 DIAGNOSIS — N411 Chronic prostatitis: Secondary | ICD-10-CM | POA: Diagnosis not present

## 2024-02-02 DIAGNOSIS — N200 Calculus of kidney: Secondary | ICD-10-CM

## 2024-02-02 LAB — URINALYSIS, ROUTINE W REFLEX MICROSCOPIC
Bilirubin, UA: NEGATIVE
Glucose, UA: NEGATIVE
Ketones, UA: NEGATIVE
Nitrite, UA: NEGATIVE
Protein,UA: NEGATIVE
Specific Gravity, UA: 1.02 (ref 1.005–1.030)
Urobilinogen, Ur: 1 mg/dL (ref 0.2–1.0)
pH, UA: 6 (ref 5.0–7.5)

## 2024-02-02 LAB — MICROSCOPIC EXAMINATION: Bacteria, UA: NONE SEEN

## 2024-02-02 MED ORDER — TAMSULOSIN HCL 0.4 MG PO CAPS: 0.4000 mg | ORAL_CAPSULE | Freq: Two times a day (BID) | ORAL | 11 refills | Status: DC

## 2024-02-02 MED ORDER — INDAPAMIDE 2.5 MG PO TABS: 2.5000 mg | ORAL_TABLET | Freq: Every day | ORAL | 11 refills | Status: AC

## 2024-02-02 MED ORDER — POTASSIUM CITRATE ER 10 MEQ (1080 MG) PO TBCR: 20.0000 meq | EXTENDED_RELEASE_TABLET | Freq: Two times a day (BID) | ORAL | 11 refills | Status: AC

## 2024-02-02 NOTE — Progress Notes (Unsigned)
 02/02/2024 9:23 AM   Thomas Alexander Jun 09, 1971 [id number]  Referring provider: Harolyn Likes, MD [id number] [place],  [place] [address]  No chief complaint on file.   HPI: [name] is a 53yo here for followup for nephrolithiasis and chronic prostatitis. Renal US  4/28 shows no calculi and no hydronephrosis. IPSS 13 QOL 3 n flomax  BID. US  today shows RBCs and WBCs. No dysuria or hematuria.   PMH: Past Medical History:  Diagnosis Date   Anxiety    Crohn disease (HCC) 2008   Diabetes mellitus without complication (HCC)    Sleep apnea     Surgical History: Past Surgical History:  Procedure Laterality Date   COLECTOMY     x 7 for crohns   COLONOSCOPY     CYSTOSCOPY  2020   CYSTOSCOPY W/ URETERAL STENT REMOVAL  07/29/2021   Procedure: CYSTOSCOPY WITH RIGHT URETERAL STENT REMOVAL;  Surgeon: Marco Severs, MD;  Location: [facility];  Service: Urology;;   CYSTOSCOPY WITH RETROGRADE PYELOGRAM, URETEROSCOPY AND STENT PLACEMENT Right 07/15/2021   Procedure: CYSTOSCOPY WITH RETROGRADE PYELOGRAM, URETEROSCOPY AND STENT PLACEMENT;  Surgeon: Marco Severs, MD;  Location: [facility];  Service: Urology;  Laterality: Right;   CYSTOSCOPY WITH RETROGRADE PYELOGRAM, URETEROSCOPY AND STENT PLACEMENT Left 07/29/2021   Procedure: CYSTOSCOPY WITH LEFT RETROGRADE PYELOGRAM, LEFT URETEROSCOPY AND LEFT URETERAL STENT PLACEMENT;  Surgeon: Marco Severs, MD;  Location: [facility];  Service: Urology;  Laterality: Left;   CYSTOSCOPY/URETEROSCOPY/HOLMIUM LASER/STENT PLACEMENT Right 09/16/2021   Procedure: CYSTOSCOPY/URETEROSCOPY/STENT PLACEMENT;  Surgeon: Marco Severs, MD;  Location: [facility];  Service: Urology;  Laterality: Right;   HAND SURGERY Left    removal of cyst   HOLMIUM LASER APPLICATION Right 07/15/2021   Procedure: HOLMIUM LASER APPLICATION;  Surgeon: Marco Severs, MD;  Location: [facility];  Service: Urology;  Laterality: Right;   HOLMIUM LASER APPLICATION Left  07/29/2021   Procedure: HOLMIUM LASER APPLICATION;  Surgeon: Marco Severs, MD;  Location: [facility];  Service: Urology;  Laterality: Left;   KNEE SURGERY Left    STONE EXTRACTION WITH BASKET Left 07/29/2021   Procedure: STONE EXTRACTION WITH BASKET;  Surgeon: Marco Severs, MD;  Location: [facility];  Service: Urology;  Laterality: Left;    Home Medications:  Allergies as of 02/02/2024       Reactions   Nsaids Other (See Comments)   Crohns Disease; takes Humira   Fish Oil Other (See Comments)   Other Reaction: BURNING IN THROAT, STOMACH - pt unaware of this reaction   Sulfamethoxazole -trimethoprim     nausea        Medication List        Accurate as of Feb 02, 2024  9:23 AM. If you have any questions, ask your nurse or doctor.          atenolol 50 MG tablet Commonly known as: TENORMIN Take 50 mg by mouth daily.   cetirizine 10 MG tablet Commonly known as: ZYRTEC Take 10 mg by mouth daily as needed for allergies.   citalopram 20 MG tablet Commonly known as: CELEXA Take 20 mg by mouth daily.   cyanocobalamin 1000 MCG/ML injection Commonly known as: VITAMIN B12 Inject 1,000 mcg into the muscle every 30 (thirty) days.   cyclobenzaprine  5 MG tablet Commonly known as: FLEXERIL  Take 1 tablet (5 mg total) by mouth 3 (three) times daily as needed for muscle spasms.   dicyclomine 10 MG capsule Commonly known as: BENTYL Take 20 mg by mouth 4 (  four) times daily -  before meals and at bedtime.   doxycycline  100 MG capsule Commonly known as: VIBRAMYCIN  Take 1 capsule (100 mg total) by mouth every 12 (twelve) hours.   gabapentin 100 MG capsule Commonly known as: NEURONTIN Take 100 mg by mouth at bedtime.   glimepiride 4 MG tablet Commonly known as: AMARYL Take 4 mg by mouth 2 (two) times daily with a meal.   Humira (2 Pen) 40 MG/0.4ML pen Generic drug: adalimumab Inject 40 mg into the skin once a week.   indapamide  2.5 MG tablet Commonly known as: LOZOL  Take  1 tablet (2.5 mg total) by mouth daily.   Jardiance 25 MG Tabs tablet Generic drug: empagliflozin Take 25 mg by mouth every morning.   lovastatin 10 MG tablet Commonly known as: MEVACOR Take 10 mg by mouth at bedtime.   magnesium  oxide 400 MG tablet Commonly known as: MAG-OX Take 1 tablet (400 mg total) by mouth daily.   mirabegron  ER 25 MG Tb24 tablet Commonly known as: MYRBETRIQ  Take 1 tablet (25 mg total) by mouth daily.   nitrofurantoin  (macrocrystal-monohydrate) 100 MG capsule Commonly known as: MACROBID  Take 1 capsule (100 mg total) by mouth every 12 (twelve) hours.   ondansetron  4 MG disintegrating tablet Commonly known as: ZOFRAN -ODT Take 1 tablet (4 mg total) by mouth every 6 (six) hours as needed.   potassium citrate  10 MEQ (1080 MG) SR tablet Commonly known as: UROCIT-K  Take 2 tablets (20 mEq total) by mouth in the morning and at bedtime.   sulfamethoxazole -trimethoprim  800-160 MG tablet Commonly known as: BACTRIM  DS Take 1 tablet by mouth every 12 (twelve) hours.   tadalafil  20 MG tablet Commonly known as: CIALIS  Take 1 tablet (20 mg total) by mouth daily as needed.   tamsulosin  0.4 MG Caps capsule Commonly known as: FLOMAX  Take 1 capsule (0.4 mg total) by mouth 2 (two) times daily.   TUMS ULTRA 1000 PO Take 1 tablet by mouth 3 (three) times daily as needed (heartburn).   UltiCare Tuberculin Safety Syr 25G X 5/8" 1 ML Misc Generic drug: TUBERCULIN SYR 1CC/25GX5/8"   Vitamin D (Ergocalciferol) 1.25 MG (50000 UNIT) Caps capsule Commonly known as: DRISDOL Take 50,000 Units by mouth every 7 (seven) days.        Allergies:  Allergies  Allergen Reactions   Nsaids Other (See Comments)    Crohns Disease; takes Humira   Fish Oil Other (See Comments)    Other Reaction: BURNING IN THROAT, STOMACH - pt unaware of this reaction   Sulfamethoxazole -Trimethoprim      nausea     Family History: Family History  Family history unknown: Yes    Social  History:  reports that he has been smoking cigarettes. He does not have any smokeless tobacco history on file. He reports that he does not currently use drugs after having used the following drugs: Marijuana. No history on file for alcohol use.  ROS: All other review of systems were reviewed and are negative except what is noted above in HPI  Physical Exam: BP 105/63   Pulse (!) 57   Constitutional:  Alert and oriented, No acute distress. HEENT: [place] AT, moist mucus membranes.  Trachea midline, no masses. Cardiovascular: No clubbing, cyanosis, or edema. Respiratory: Normal respiratory effort, no increased work of breathing. GI: Abdomen is soft, nontender, nondistended, no abdominal masses GU: No CVA tenderness.  Lymph: No cervical or inguinal lymphadenopathy. Skin: No rashes, bruises or suspicious lesions. Neurologic: Grossly intact, no focal deficits, moving  all 4 extremities. Psychiatric: Normal mood and affect.  Laboratory Data: Lab Results  Component Value Date   WBC 5.1 07/13/2021   HGB 14.6 07/13/2021   HCT 42.2 07/13/2021   MCV 99 (H) 07/13/2021   PLT 81 (LL) 07/13/2021    Lab Results  Component Value Date   CREATININE 1.00 10/10/2023    No results found for: "PSA"  Lab Results  Component Value Date   TESTOSTERONE  447 08/16/2022    No results found for: "HGBA1C"  Urinalysis    Component Value Date/Time   APPEARANCEUR Cloudy (A) 12/29/2023 0948   GLUCOSEU 3+ (A) 12/29/2023 0948   BILIRUBINUR Negative 12/29/2023 0948   PROTEINUR Negative 12/29/2023 0948   NITRITE Negative 12/29/2023 0948   LEUKOCYTESUR 1+ (A) 12/29/2023 0948    Lab Results  Component Value Date   LABMICR See below: 12/29/2023   WBCUA 11-30 (A) 12/29/2023   LABEPIT 0-10 12/29/2023   MUCUS Present 02/11/2022   BACTERIA Few (A) 12/29/2023    Pertinent Imaging: *** Results for orders placed in visit on 08/09/21  DG Abd 1 View  Narrative CLINICAL DATA:  Kidney  stone  EXAM: ABDOMEN - 1 VIEW  COMPARISON:  Knee radiograph dated July 13, 2021  FINDINGS: Normal bowel gas pattern. Decreased size and number of renal stones when compared with prior exam. Largest stone of the right kidney measures 7 mm and is located in the lower pole. Largest stone of the left kidney measures 6 mm and is located in lower pole. Right pelvic calcification is less well seen on today's exam and is also likely decreased in size compared to prior exam.  IMPRESSION: Bilateral nephrolithiasis, overall decreased in size and number when compared with prior exam.   Electronically Signed By: Avelino Lek M.D. On: 08/10/2021 13:20  No results found for this or any previous visit.  No results found for this or any previous visit.  No results found for this or any previous visit.  Results for orders placed during the hospital encounter of 01/22/24  Ultrasound renal complete  Narrative CLINICAL DATA:  Nephrolithiasis.  Recurrent urinary tract infection.  EXAM: RENAL / URINARY TRACT ULTRASOUND COMPLETE  COMPARISON:  May 24  FINDINGS: Right Kidney:  Renal measurements: 14.3 x 7.5 x 7 8 cm = volume: 437.5 mL. Echogenicity within normal limits. No mass or hydronephrosis visualized.  Left Kidney:  Renal measurements: 12.6 x 7.1 x 5.8 cm = volume: 270.2 mL. Echogenicity within normal limits. No mass or hydronephrosis visualized.  Bladder:  Appears normal for degree of bladder distention.  Other:  None.  IMPRESSION: Normal renal ultrasound.   Electronically Signed By: Anna Barnes M.D. On: 01/22/2024 16:47  No results found for this or any previous visit.  Results for orders placed during the hospital encounter of 10/10/23  CT HEMATURIA WORKUP  Narrative CLINICAL DATA:  Gross hematuria.  Dysuria.  EXAM: CT ABDOMEN AND PELVIS WITHOUT AND WITH CONTRAST  TECHNIQUE: Multidetector CT imaging of the abdomen and pelvis was  performed following the standard protocol before and following the bolus administration of intravenous contrast.  RADIATION DOSE REDUCTION: This exam was performed according to the departmental dose-optimization program which includes automated exposure control, adjustment of the mA and/or kV according to patient size and/or use of iterative reconstruction technique.  CONTRAST:  OMNIPAQUE  IOHEXOL  300 MG/ML  SOLN  COMPARISON:  None Available.  FINDINGS: Lower chest: Unremarkable.  Hepatobiliary: Heterogeneous liver parenchyma with marked nodularity of the liver contour is compatible  with cirrhosis. Gallbladder is surgically absent. No intrahepatic or extrahepatic biliary dilation.  Pancreas: Pancreas is atrophic with dilatation of the main duct in the head of the pancreas. 16 x 9 mm stone in the head of the pancreas may be within the distal main pancreatic duct but appears separate from the common bile duct on axial and coronal imaging. Subtle peripancreatic edema/inflammation suggest pancreatitis.  Spleen: Borderline splenomegaly.  Adrenals/Urinary Tract: No adrenal nodule or mass.  Precontrast imaging shows no stones in either kidney or ureter. No bladder stones.  Postcontrast imaging shows no suspicious enhancing mass lesion in either kidney. Tiny well-defined homogeneous low-density lesions in both kidneys are too small to characterize but are statistically most likely benign and probably cysts. No followup imaging is recommended.  Delayed post-contrast imaging shows no wall thickening or soft tissue filling defect in either intrarenal collecting system or renal pelvis. Neither ureter is completely opacified, but there is no evidence for focal hydroureter, ureteral wall thickening, or soft tissue mass lesion along the course of either ureter. Bladder is un opacified. Within this limitation, no focal bladder wall abnormality is evident.  Stomach/Bowel: Stomach  is unremarkable. No gastric wall thickening. No evidence of outlet obstruction. Duodenum is normally positioned as is the ligament of Treitz. No small bowel wall thickening. No small bowel dilatation. Status post right hemicolectomy moderate stool volume.  Vascular/Lymphatic: There is mild atherosclerotic calcification of the abdominal aorta without aneurysm. Portal vein and superior mesenteric vein are patent. Splenic vein is patent. There is no gastrohepatic or hepatoduodenal ligament lymphadenopathy. No retroperitoneal or mesenteric lymphadenopathy. No pelvic sidewall lymphadenopathy.  Reproductive: The prostate gland and seminal vesicles are unremarkable.  Other: No intraperitoneal free fluid.  Musculoskeletal: No worrisome lytic or sclerotic osseous abnormality. 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 Hernia sacs contain omental fat but no bowel.  IMPRESSION: 1. No urinary tract stones, hydronephrosis, or suspicious enhancing mass lesion in either kidney or ureter. No findings to explain the patient's history of hematuria. 2. Heterogeneous liver parenchyma with marked nodularity of the liver contour compatible with cirrhosis. No focal liver lesion by CT. 3. Borderline splenomegaly. 4. Pancreas is atrophic with dilatation of the main duct in the head of the pancreas. 16 x 9 mm stone in the head of the pancreas may be within the distal main pancreatic duct and appears separate from the common bile duct on axial and coronal imaging. Subtle peripancreatic edema/inflammation is concerning for acute pancreatitis. 5. 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 Hernia sacs contain omental fat but no bowel. 6.  Aortic Atherosclerosis (ICD10-I70.0).  These results will be called to the ordering clinician or representative by the Radiologist Assistant, and communication documented in the PACS or [employer].   Electronically Signed By: Donnal Fusi M.D. On: 10/20/2023 07:04  No results found for this or any previous visit.   Assessment & Plan:    1. Kidney stones (Primary) *** - Urinalysis, Routine w reflex microscopic  2. Urinary frequency ***  3. 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  Johnie Nailer, MD  [facility] [place]

## 2024-02-04 LAB — URINE CULTURE

## 2024-02-05 ENCOUNTER — Telehealth: Payer: Self-pay | Admitting: Urology

## 2024-02-05 NOTE — Telephone Encounter (Signed)
 Patient returned call requesting urine results.   Test was completed on 02/02/24.   Best number to contact patient [phone]  Patient wants to know since there is bacteria in his urine, are you going to call him in an antibiotic?    Call transferred to clinical basket. Notified patient someone would return their call in 24 hours.

## 2024-02-05 NOTE — Telephone Encounter (Signed)
 Patient called and made aware urine culture was negative.  Patient denies any UTI symptoms.  Patient advised to call office if he develops symptoms. Patient voiced understanding.

## 2024-02-08 ENCOUNTER — Encounter: Payer: Self-pay | Admitting: Urology

## 2024-02-08 NOTE — Patient Instructions (Signed)

## 2024-03-27 ENCOUNTER — Ambulatory Visit: Admitting: Urology

## 2024-04-25 IMAGING — US US RENAL
1 series · 14 of 25 positions shown · non-contrast
Comparison: September 30, 2021 ultrasound.

CLINICAL DATA: Kidney stone follow-up.

EXAM:
RENAL / URINARY TRACT ULTRASOUND COMPLETE

[Series 1: us renal · 14 of 50 slices shown]
[im 1/50]
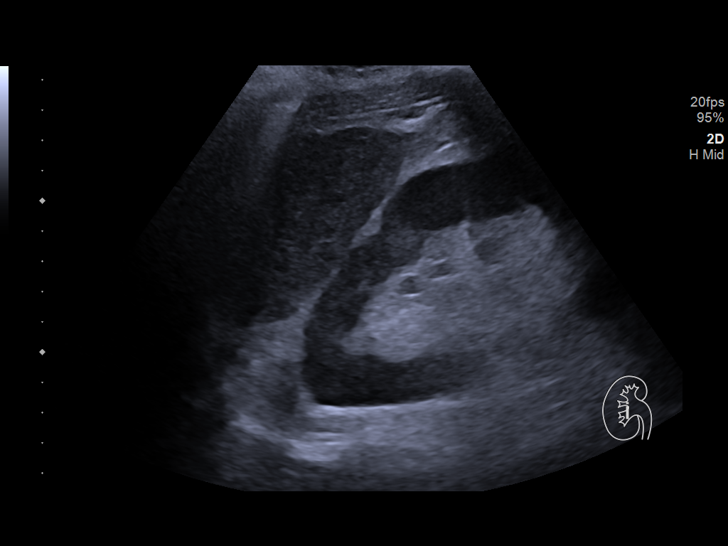
[im 5/50]
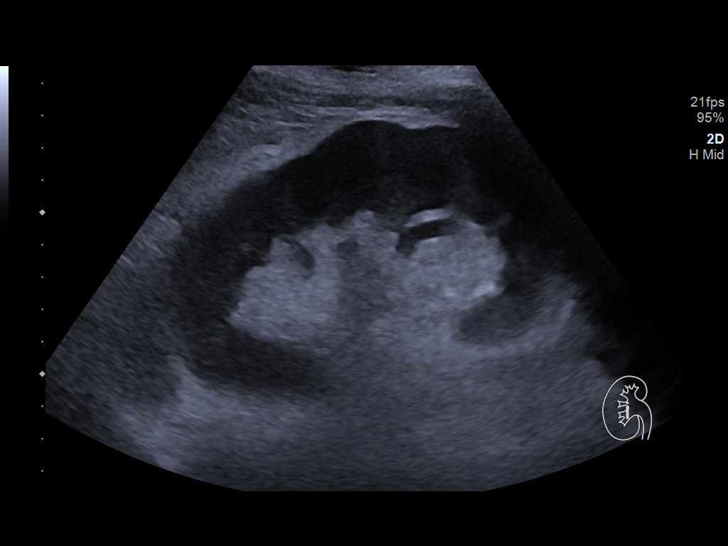
[im 9/50]
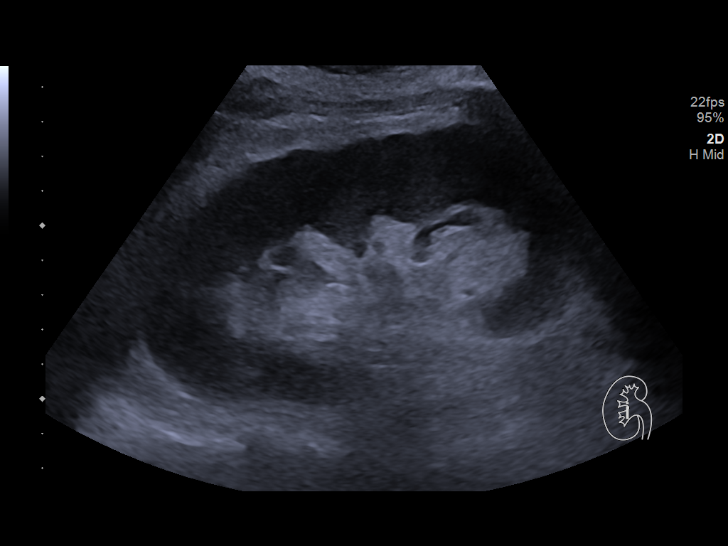
[im 13/50]
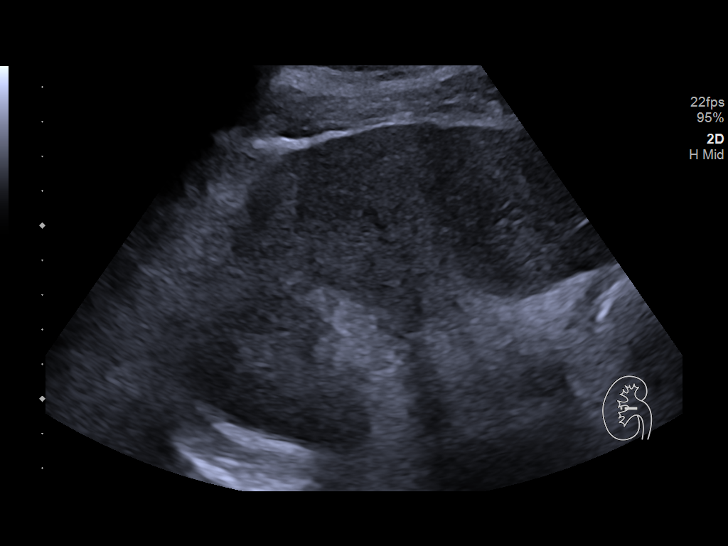
[im 17/50]
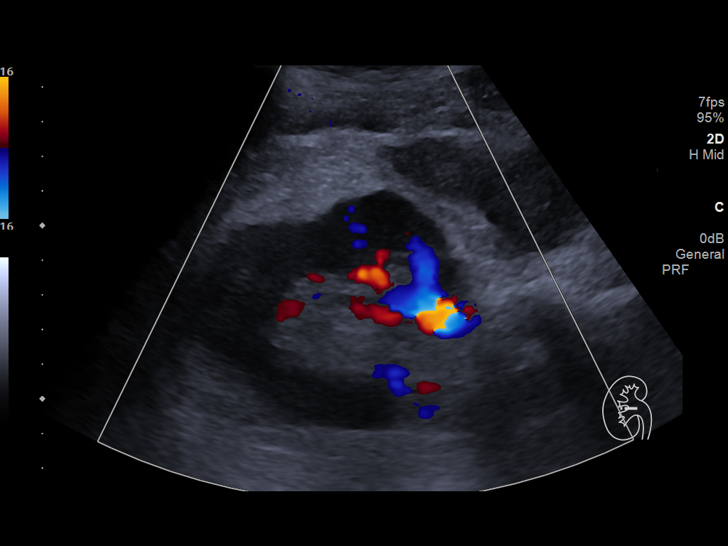
[im 19/50]
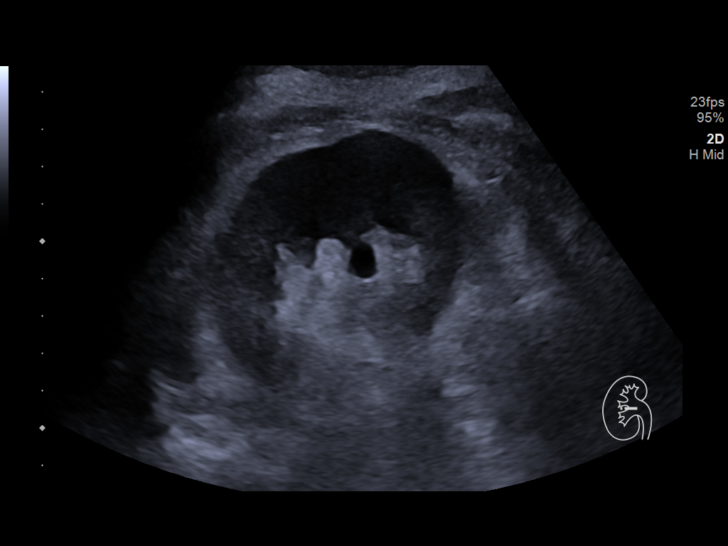
[im 23/50]
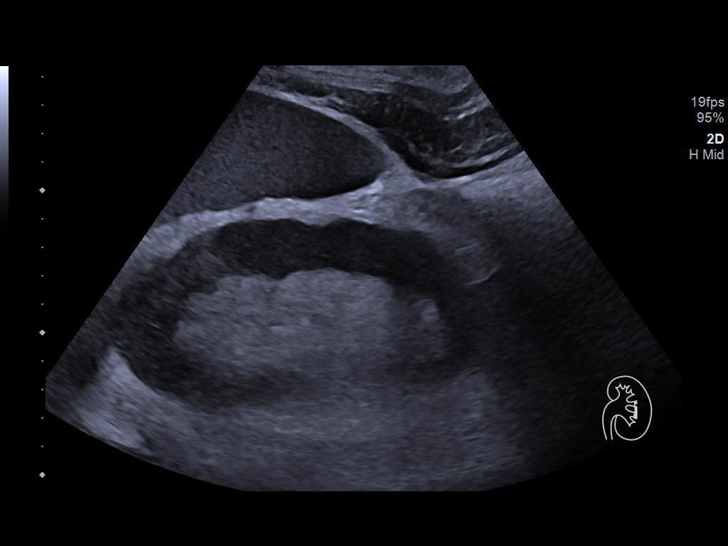
[im 27/50]
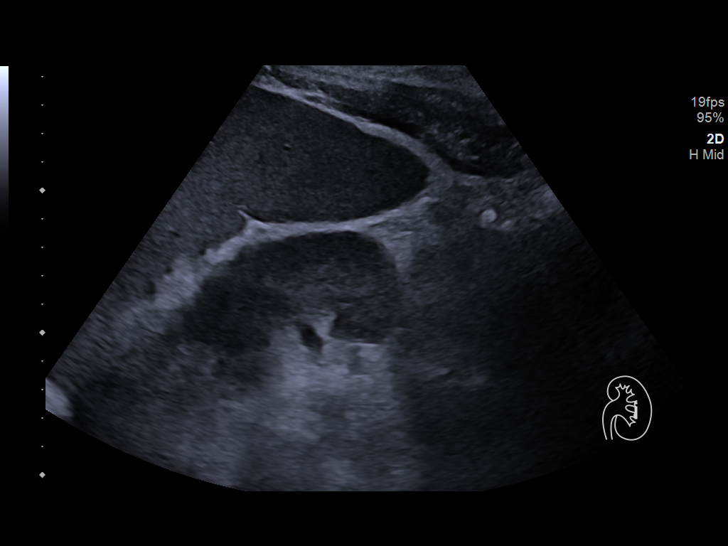
[im 31/50]
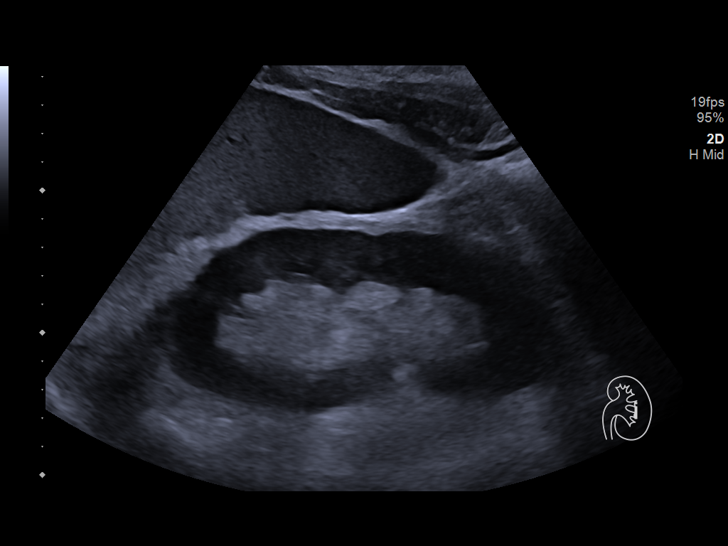
[im 33/50]
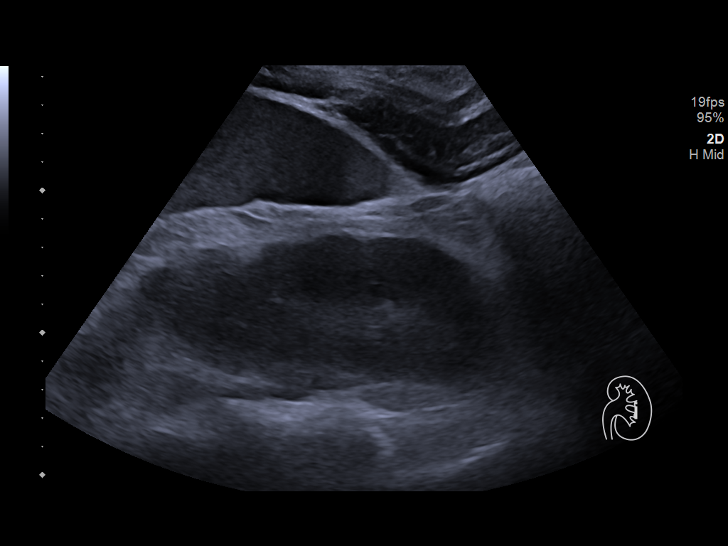
[im 37/50]
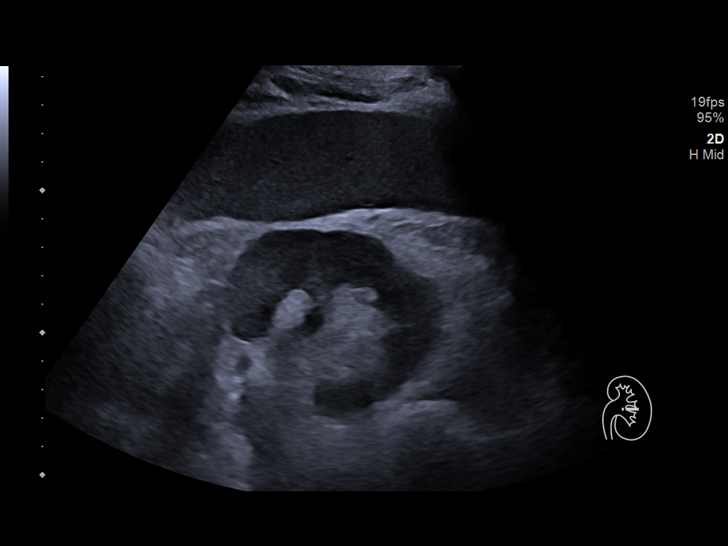
[im 41/50]
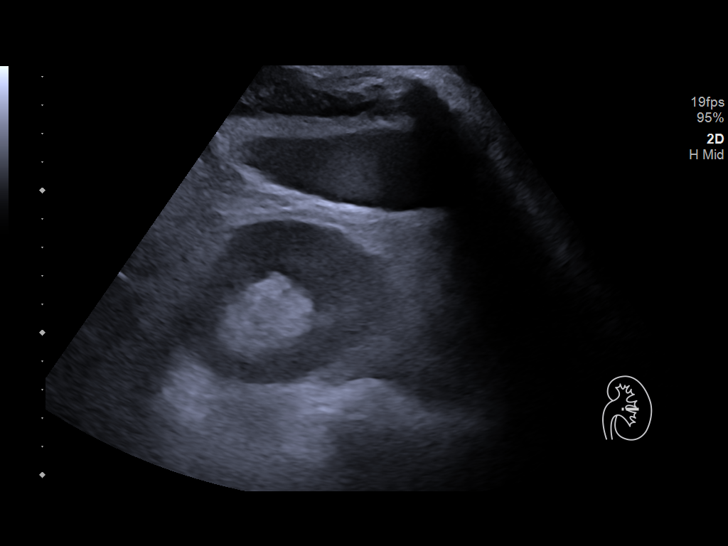
[im 45/50]
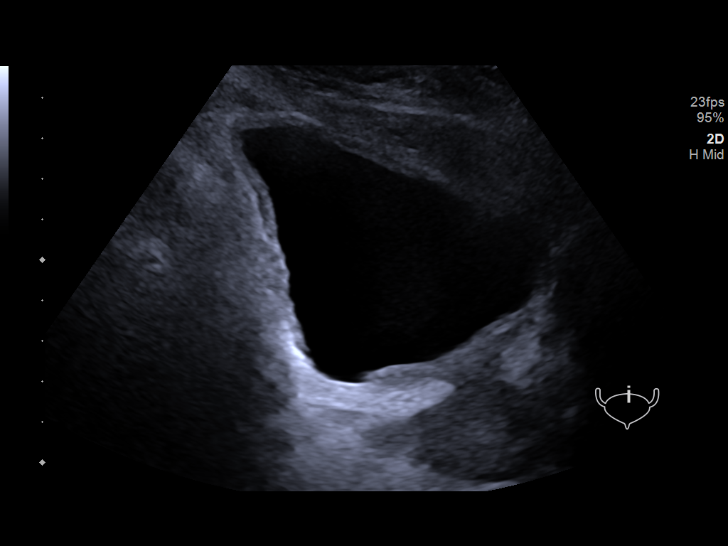
[im 50/50]
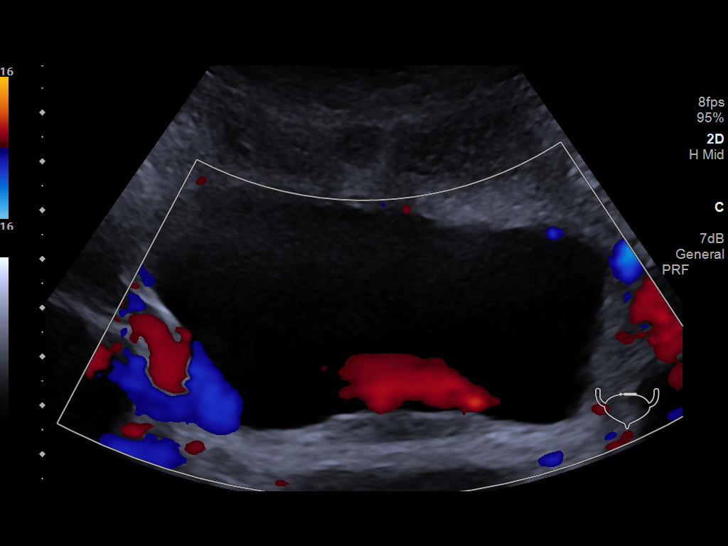

[14 of 25 positions shown; findings below may reference images not displayed]

FINDINGS: Right Kidney:

Renal measurements: 12.6 x 8.1 x 7.8 cm = volume: 413 mL.
Echogenicity within normal limits. No mass or hydronephrosis
visualized.

Left Kidney:

Renal measurements: 13.2 x 6.7 x 6.5 cm = volume: 302 mL.
Echogenicity within normal limits. No mass or hydronephrosis
visualized.

Bladder:

Appears normal for degree of bladder distention.

Other:

None.
IMPRESSION: 1. No abnormalities are identified.  No stones are noted.

## 2024-06-21 ENCOUNTER — Ambulatory Visit: Admitting: Urology

## 2024-06-21 VITALS — BP 110/67 | HR 58

## 2024-06-21 DIAGNOSIS — R300 Dysuria: Secondary | ICD-10-CM

## 2024-06-21 DIAGNOSIS — N200 Calculus of kidney: Secondary | ICD-10-CM

## 2024-06-21 LAB — URINALYSIS, ROUTINE W REFLEX MICROSCOPIC
Bilirubin, UA: NEGATIVE
Glucose, UA: NEGATIVE
Ketones, UA: NEGATIVE
Nitrite, UA: NEGATIVE
Specific Gravity, UA: 1.025 (ref 1.005–1.030)
Urobilinogen, Ur: 2 mg/dL — ABNORMAL HIGH (ref 0.2–1.0)
pH, UA: 6.5 (ref 5.0–7.5)

## 2024-06-21 LAB — MICROSCOPIC EXAMINATION: WBC, UA: >30 /HPF — AB (ref 0–5)

## 2024-06-21 MED ORDER — DOXYCYCLINE HYCLATE 100 MG PO CAPS: 100.0000 mg | ORAL_CAPSULE | Freq: Two times a day (BID) | ORAL | 0 refills | Status: AC

## 2024-06-21 MED ORDER — TAMSULOSIN HCL 0.4 MG PO CAPS: 0.4000 mg | ORAL_CAPSULE | Freq: Every day | ORAL | 11 refills | Status: AC

## 2024-06-21 NOTE — Progress Notes (Signed)
 06/21/2024 12:52 PM   Thomas Alexander 02/02/1971 [id number]  Referring provider: Lawrance Handing, MD [id number] [facility] [address] [place],  [place] [address]  dysuria   HPI: [name] is a 53yo here for evaluation of burning with urination. Starting 1 month ago he developed burning with urination. UA is concerning for infection. He has increased urinary urgency and frequency. He is off jardiance. He has stopped his flomax  0.4mg  since last visit    PMH: Past Medical History:  Diagnosis Date   Anxiety    Crohn disease (HCC) 2008   Diabetes mellitus without complication (HCC)    Sleep apnea     Surgical History: Past Surgical History:  Procedure Laterality Date   COLECTOMY     x 7 for crohns   COLONOSCOPY     CYSTOSCOPY  2020   CYSTOSCOPY W/ URETERAL STENT REMOVAL  07/29/2021   Procedure: CYSTOSCOPY WITH RIGHT URETERAL STENT REMOVAL;  Surgeon: Sherrilee Belvie CROME, MD;  Location: [facility];  Service: Urology;;   CYSTOSCOPY WITH RETROGRADE PYELOGRAM, URETEROSCOPY AND STENT PLACEMENT Right 07/15/2021   Procedure: CYSTOSCOPY WITH RETROGRADE PYELOGRAM, URETEROSCOPY AND STENT PLACEMENT;  Surgeon: Sherrilee Belvie CROME, MD;  Location: [facility];  Service: Urology;  Laterality: Right;   CYSTOSCOPY WITH RETROGRADE PYELOGRAM, URETEROSCOPY AND STENT PLACEMENT Left 07/29/2021   Procedure: CYSTOSCOPY WITH LEFT RETROGRADE PYELOGRAM, LEFT URETEROSCOPY AND LEFT URETERAL STENT PLACEMENT;  Surgeon: Sherrilee Belvie CROME, MD;  Location: [facility];  Service: Urology;  Laterality: Left;   CYSTOSCOPY/URETEROSCOPY/HOLMIUM LASER/STENT PLACEMENT Right 09/16/2021   Procedure: CYSTOSCOPY/URETEROSCOPY/STENT PLACEMENT;  Surgeon: Sherrilee Belvie CROME, MD;  Location: [facility];  Service: Urology;  Laterality: Right;   HAND SURGERY Left    removal of cyst   HOLMIUM LASER APPLICATION Right 07/15/2021   Procedure: HOLMIUM LASER APPLICATION;  Surgeon: Sherrilee Belvie CROME, MD;  Location: [facility];  Service: Urology;  Laterality: Right;    HOLMIUM LASER APPLICATION Left 07/29/2021   Procedure: HOLMIUM LASER APPLICATION;  Surgeon: Sherrilee Belvie CROME, MD;  Location: [facility];  Service: Urology;  Laterality: Left;   KNEE SURGERY Left    STONE EXTRACTION WITH BASKET Left 07/29/2021   Procedure: STONE EXTRACTION WITH BASKET;  Surgeon: Sherrilee Belvie CROME, MD;  Location: [facility];  Service: Urology;  Laterality: Left;    Home Medications:  Allergies as of 06/21/2024       Reactions   Nsaids Other (See Comments)   Crohns Disease; takes Humira   Fish Oil Other (See Comments)   Other Reaction: BURNING IN THROAT, STOMACH - pt unaware of this reaction   Sulfamethoxazole -trimethoprim     nausea        Medication List        Accurate as of June 21, 2024 12:52 PM. If you have any questions, ask your nurse or doctor.          atenolol 50 MG tablet Commonly known as: TENORMIN Take 50 mg by mouth daily.   cetirizine 10 MG tablet Commonly known as: ZYRTEC Take 10 mg by mouth daily as needed for allergies.   citalopram 20 MG tablet Commonly known as: CELEXA Take 20 mg by mouth daily.   cyanocobalamin 1000 MCG/ML injection Commonly known as: VITAMIN B12 Inject 1,000 mcg into the muscle every 30 (thirty) days.   cyclobenzaprine  5 MG tablet Commonly known as: FLEXERIL  Take 1 tablet (5 mg total) by mouth 3 (three) times daily as needed for muscle spasms.   dicyclomine 10 MG capsule Commonly known as: BENTYL Take 20 mg  by mouth 4 (four) times daily -  before meals and at bedtime.   doxycycline  100 MG capsule Commonly known as: VIBRAMYCIN  Take 1 capsule (100 mg total) by mouth every 12 (twelve) hours.   gabapentin 100 MG capsule Commonly known as: NEURONTIN Take 100 mg by mouth at bedtime.   glimepiride 4 MG tablet Commonly known as: AMARYL Take 4 mg by mouth 2 (two) times daily with a meal.   Humira (2 Pen) 40 MG/0.4ML pen Generic drug: adalimumab Inject 40 mg into the skin once a week.   indapamide  2.5  MG tablet Commonly known as: LOZOL  Take 1 tablet (2.5 mg total) by mouth daily.   Jardiance 25 MG Tabs tablet Generic drug: empagliflozin Take 25 mg by mouth every morning.   lovastatin 10 MG tablet Commonly known as: MEVACOR Take 10 mg by mouth at bedtime.   magnesium  oxide 400 MG tablet Commonly known as: MAG-OX Take 1 tablet (400 mg total) by mouth daily.   mirabegron  ER 25 MG Tb24 tablet Commonly known as: MYRBETRIQ  Take 1 tablet (25 mg total) by mouth daily.   nitrofurantoin  (macrocrystal-monohydrate) 100 MG capsule Commonly known as: MACROBID  Take 1 capsule (100 mg total) by mouth every 12 (twelve) hours.   ondansetron  4 MG disintegrating tablet Commonly known as: ZOFRAN -ODT Take 1 tablet (4 mg total) by mouth every 6 (six) hours as needed.   potassium citrate  10 MEQ (1080 MG) SR tablet Commonly known as: UROCIT-K  Take 2 tablets (20 mEq total) by mouth in the morning and at bedtime.   sulfamethoxazole -trimethoprim  800-160 MG tablet Commonly known as: BACTRIM  DS Take 1 tablet by mouth every 12 (twelve) hours.   tadalafil  20 MG tablet Commonly known as: CIALIS  Take 1 tablet (20 mg total) by mouth daily as needed.   tamsulosin  0.4 MG Caps capsule Commonly known as: FLOMAX  Take 1 capsule (0.4 mg total) by mouth 2 (two) times daily.   TUMS ULTRA 1000 PO Take 1 tablet by mouth 3 (three) times daily as needed (heartburn).   UltiCare Tuberculin Safety Syr 25G X 5/8 1 ML Misc Generic drug: TUBERCULIN SYR 1CC/25GX5/8   Vitamin D (Ergocalciferol) 1.25 MG (50000 UNIT) Caps capsule Commonly known as: DRISDOL Take 50,000 Units by mouth every 7 (seven) days.        Allergies:  Allergies  Allergen Reactions   Nsaids Other (See Comments)    Crohns Disease; takes Humira   Fish Oil Other (See Comments)    Other Reaction: BURNING IN THROAT, STOMACH - pt unaware of this reaction   Sulfamethoxazole -Trimethoprim      nausea     Family History: Family History   Family history unknown: Yes    Social History:  reports that he has been smoking cigarettes. He does not have any smokeless tobacco history on file. He reports that he does not currently use drugs after having used the following drugs: Marijuana. No history on file for alcohol use.  ROS: All other review of systems were reviewed and are negative except what is noted above in HPI  Physical Exam: BP 110/67   Pulse (!) 58   Constitutional:  Alert and oriented, No acute distress. HEENT: [place] AT, moist mucus membranes.  Trachea midline, no masses. Cardiovascular: No clubbing, cyanosis, or edema. Respiratory: Normal respiratory effort, no increased work of breathing. GI: Abdomen is soft, nontender, nondistended, no abdominal masses GU: No CVA tenderness.  Lymph: No cervical or inguinal lymphadenopathy. Skin: No rashes, bruises or suspicious lesions. Neurologic: Grossly intact, no  focal deficits, moving all 4 extremities. Psychiatric: Normal mood and affect.  Laboratory Data: Lab Results  Component Value Date   WBC 5.1 07/13/2021   HGB 14.6 07/13/2021   HCT 42.2 07/13/2021   MCV 99 (H) 07/13/2021   PLT 81 (LL) 07/13/2021    Lab Results  Component Value Date   CREATININE 1.00 10/10/2023    No results found for: PSA  Lab Results  Component Value Date   TESTOSTERONE  447 08/16/2022    No results found for: HGBA1C  Urinalysis    Component Value Date/Time   APPEARANCEUR Clear 02/02/2024 0914   GLUCOSEU Negative 02/02/2024 0914   BILIRUBINUR Negative 02/02/2024 0914   PROTEINUR Negative 02/02/2024 0914   NITRITE Negative 02/02/2024 0914   LEUKOCYTESUR 1+ (A) 02/02/2024 0914    Lab Results  Component Value Date   LABMICR See below: 02/02/2024   WBCUA 11-30 (A) 02/02/2024   LABEPIT 0-10 02/02/2024   MUCUS Present (A) 02/02/2024   BACTERIA None seen 02/02/2024    Pertinent Imaging:  Results for orders placed in visit on 08/09/21  DG Abd 1  View  Narrative CLINICAL DATA:  Kidney stone  EXAM: ABDOMEN - 1 VIEW  COMPARISON:  Knee radiograph dated July 13, 2021  FINDINGS: Normal bowel gas pattern. Decreased size and number of renal stones when compared with prior exam. Largest stone of the right kidney measures 7 mm and is located in the lower pole. Largest stone of the left kidney measures 6 mm and is located in lower pole. Right pelvic calcification is less well seen on today's exam and is also likely decreased in size compared to prior exam.  IMPRESSION: Bilateral nephrolithiasis, overall decreased in size and number when compared with prior exam.   Electronically Signed By: Rea Marc M.D. On: 08/10/2021 13:20  No results found for this or any previous visit.  No results found for this or any previous visit.  No results found for this or any previous visit.  Results for orders placed during the hospital encounter of 01/22/24  Ultrasound renal complete  Narrative CLINICAL DATA:  Nephrolithiasis.  Recurrent urinary tract infection.  EXAM: RENAL / URINARY TRACT ULTRASOUND COMPLETE  COMPARISON:  May 24  FINDINGS: Right Kidney:  Renal measurements: 14.3 x 7.5 x 7 8 cm = volume: 437.5 mL. Echogenicity within normal limits. No mass or hydronephrosis visualized.  Left Kidney:  Renal measurements: 12.6 x 7.1 x 5.8 cm = volume: 270.2 mL. Echogenicity within normal limits. No mass or hydronephrosis visualized.  Bladder:  Appears normal for degree of bladder distention.  Other:  None.  IMPRESSION: Normal renal ultrasound.   Electronically Signed By: Craig Farr M.D. On: 01/22/2024 16:47  No results found for this or any previous visit.  Results for orders placed during the hospital encounter of 10/10/23  CT HEMATURIA WORKUP  Narrative CLINICAL DATA:  Gross hematuria.  Dysuria.  EXAM: CT ABDOMEN AND PELVIS WITHOUT AND WITH CONTRAST  TECHNIQUE: Multidetector CT imaging  of the abdomen and pelvis was performed following the standard protocol before and following the bolus administration of intravenous contrast.  RADIATION DOSE REDUCTION: This exam was performed according to the departmental dose-optimization program which includes automated exposure control, adjustment of the mA and/or kV according to patient size and/or use of iterative reconstruction technique.  CONTRAST:  125mL OMNIPAQUE  IOHEXOL  300 MG/ML  SOLN  COMPARISON:  None Available.  FINDINGS: Lower chest: Unremarkable.  Hepatobiliary: Heterogeneous liver parenchyma with marked nodularity of the liver contour  is compatible with cirrhosis. Gallbladder is surgically absent. No intrahepatic or extrahepatic biliary dilation.  Pancreas: Pancreas is atrophic with dilatation of the main duct in the head of the pancreas. 16 x 9 mm stone in the head of the pancreas may be within the distal main pancreatic duct but appears separate from the common bile duct on axial and coronal imaging. Subtle peripancreatic edema/inflammation suggest pancreatitis.  Spleen: Borderline splenomegaly.  Adrenals/Urinary Tract: No adrenal nodule or mass.  Precontrast imaging shows no stones in either kidney or ureter. No bladder stones.  Postcontrast imaging shows no suspicious enhancing mass lesion in either kidney. Tiny well-defined homogeneous low-density lesions in both kidneys are too small to characterize but are statistically most likely benign and probably cysts. No followup imaging is recommended.  Delayed post-contrast imaging shows no wall thickening or soft tissue filling defect in either intrarenal collecting system or renal pelvis. Neither ureter is completely opacified, but there is no evidence for focal hydroureter, ureteral wall thickening, or soft tissue mass lesion along the course of either ureter. Bladder is un opacified. Within this limitation, no focal bladder wall abnormality is  evident.  Stomach/Bowel: Stomach is unremarkable. No gastric wall thickening. No evidence of outlet obstruction. Duodenum is normally positioned as is the ligament of Treitz. No small bowel wall thickening. No small bowel dilatation. Status post right hemicolectomy moderate stool volume.  Vascular/Lymphatic: There is mild atherosclerotic calcification of the abdominal aorta without aneurysm. Portal vein and superior mesenteric vein are patent. Splenic vein is patent. There is no gastrohepatic or hepatoduodenal ligament lymphadenopathy. No retroperitoneal or mesenteric lymphadenopathy. No pelvic sidewall lymphadenopathy.  Reproductive: The prostate gland and seminal vesicles are unremarkable.  Other: No intraperitoneal free fluid.  Musculoskeletal: No worrisome lytic or sclerotic osseous abnormality. 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 Hernia sacs contain omental fat but no bowel.  IMPRESSION: 1. No urinary tract stones, hydronephrosis, or suspicious enhancing mass lesion in either kidney or ureter. No findings to explain the patient's history of hematuria. 2. Heterogeneous liver parenchyma with marked nodularity of the liver contour compatible with cirrhosis. No focal liver lesion by CT. 3. Borderline splenomegaly. 4. Pancreas is atrophic with dilatation of the main duct in the head of the pancreas. 16 x 9 mm stone in the head of the pancreas may be within the distal main pancreatic duct and appears separate from the common bile duct on axial and coronal imaging. Subtle peripancreatic edema/inflammation is concerning for acute pancreatitis. 5. 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 Hernia sacs contain omental fat but no bowel. 6.  Aortic Atherosclerosis (ICD10-I70.0).  These results will be called to the ordering clinician or representative by the Radiologist Assistant, and communication documented in the PACS  or [employer].   Electronically Signed By: Camellia Candle M.D. On: 10/20/2023 07:04  No results found for this or any previous visit.   Assessment & Plan:    1. Burning with urination (Primary) Urine for culture -doxycycline  100mg  BID for 14 days -restart flomax  0.4mg  daily.    No follow-ups on file.  Belvie Clara, MD  [facility] [place]

## 2024-06-24 ENCOUNTER — Telehealth: Payer: Self-pay | Admitting: Urology

## 2024-06-24 LAB — URINE CULTURE

## 2024-06-24 NOTE — Telephone Encounter (Signed)
 Return call to pt. He is aware a message will be sent to MD on alternative antibiotic. Verbalized understanding

## 2024-06-24 NOTE — Telephone Encounter (Signed)
 Patient called into the office today with general questions/concerns regarding doxy. He is having diarrhea and upset stomach and wants to see if he can get Cipro  Patient may be reached at [phone] to discuss questions.

## 2024-06-25 ENCOUNTER — Encounter: Payer: Self-pay | Admitting: Urology

## 2024-06-25 ENCOUNTER — Ambulatory Visit: Payer: Self-pay

## 2024-06-25 MED ORDER — FLUCONAZOLE 100 MG PO TABS: 100.0000 mg | ORAL_TABLET | Freq: Every day | ORAL | 0 refills | Status: AC

## 2024-06-25 NOTE — Patient Instructions (Signed)
 Urinary Tract Infection, Male A urinary tract infection (UTI) is an infection in your urinary tract. The urinary tract is made up of the organs that make, store, and get rid of pee (urine) in your body. These organs include: The kidneys. The ureters. The bladder. The urethra. What are the causes? Most UTIs are caused by germs called bacteria. They may be in or near your genitals. These germs grow and cause swelling in your urinary tract. What increases the risk? You're more likely to get a UTI if: You have a soft tube called a catheter that drains your pee. You can't control when you pee or poop. You have trouble peeing because of: A prostate that's bigger than normal. A kidney stone. A urinary blockage. A nerve condition that affects your bladder. Not getting enough to drink. You're sexually active. You're an older adult. You're also more likely to get a UTI if you have other health problems, such as: Diabetes. A weak immune system. Your immune system is your body's defense system. Sickle cell disease. Injury of the spine. What are the signs or symptoms? Symptoms may include: Needing to pee right away. Peeing small amounts often. Trouble getting the stream started. Pain or burning when you pee. Blood in your pee. Pee that smells bad or odd. Pain in your belly or lower back. You may also: Feel confused. This may be the first symptom in older adults. Vomit. Not feel hungry. Feel tired or easily annoyed. Have a fever or chills. How is this diagnosed? A UTI is diagnosed based on your medical history and an exam. You may also have other tests. These may include: Pee tests. Blood tests. Tests for sexually transmitted infections (STIs). If you've had more than one UTI, you may need to have imaging studies done to find out why you keep getting them. How is this treated? A UTI can be treated by: Taking antibiotics or other medicines. Drinking enough fluid to keep your pee  pale yellow. In rare cases, a UTI can cause a very bad condition called sepsis. Sepsis may be treated in the hospital. Follow these instructions at home: Medicines Take your medicines only as told by your health care provider. If you were given antibiotics, take them as told by your provider. Do not stop taking them even if you start to feel better. General instructions Make sure you: Pee often and fully. Do not hold your pee for a long time. Pee after you have sex. Contact a health care provider if: Your symptoms don't get better after 1-2 days of taking antibiotics. Your symptoms go away and then come back. You have a fever or chills. You vomit or feel like you may vomit. Get help right away if: You have very bad pain in your back or lower belly. You faint. This information is not intended to replace advice given to you by your health care provider. Make sure you discuss any questions you have with your health care provider. Document Revised: 08/23/2023 Document Reviewed: 12/16/2022 Elsevier Patient Education  2025 [employer].

## 2024-06-25 NOTE — Telephone Encounter (Signed)
-----   Message from Belvie Clara sent at 06/25/2024  8:28 AM EDT ----- Please send in diflucan 100mg  daily for 7 days ----- Message ----- From: Malachy Slice, LPN Sent: [id number]   8:14 AM EDT To: Belvie LITTIE Clara, MD  Started on doxy ----- Message ----- From: Rebecka Memos Lab Results In Sent: 06/21/2024   3:36 PM EDT To: Belvie LITTIE Clara, MD

## 2024-06-25 NOTE — Telephone Encounter (Signed)
 Patient called and made aware of diflucan sent in to pharmacy due to urine culture per Dr. Sherrilee.

## 2024-08-05 ENCOUNTER — Ambulatory Visit: Admitting: Urology

## 2024-08-16 ENCOUNTER — Other Ambulatory Visit: Payer: Self-pay

## 2024-08-16 ENCOUNTER — Telehealth: Payer: Self-pay

## 2024-08-16 ENCOUNTER — Other Ambulatory Visit

## 2024-08-16 DIAGNOSIS — R300 Dysuria: Secondary | ICD-10-CM

## 2024-08-16 LAB — MICROSCOPIC EXAMINATION

## 2024-08-16 LAB — URINALYSIS, ROUTINE W REFLEX MICROSCOPIC
Bilirubin, UA: NEGATIVE
Ketones, UA: NEGATIVE
Nitrite, UA: NEGATIVE
Protein,UA: NEGATIVE
Specific Gravity, UA: 1.01 (ref 1.005–1.030)
Urobilinogen, Ur: 8 mg/dL — ABNORMAL HIGH (ref 0.2–1.0)
pH, UA: 7.5 (ref 5.0–7.5)

## 2024-08-16 MED ORDER — AMOXICILLIN-POT CLAVULANATE 875-125 MG PO TABS: 1.0000 | ORAL_TABLET | Freq: Two times a day (BID) | ORAL | 0 refills | Status: AC

## 2024-08-16 NOTE — Telephone Encounter (Signed)
 Dysuria  Patient called with c/o dysuria x 3 weeks days.  Pain: burning  Severity:6/10  Associated Signs and Symptoms:  Fever: noTemp.0 Chills: yes Hematuria: no Urgency: yes Frequency: yes Hesitancy:no Incontinence: no Nausea: no Vomiting: no  Message sent to clinic staff to return call to patient to advise of plan.

## 2024-08-16 NOTE — Telephone Encounter (Signed)
 Patient presents today with complaints of  burning with urination.  UA and Culture done today.  Dr. Sherrilee  reviewed results and Augmentin  875-125 mg   BID X 7 days .  Patient aware of MD recommendations and that we will reach out with culture results.      Dyjwpvlj, CMA

## 2024-08-18 LAB — URINE CULTURE: Organism ID, Bacteria: NO GROWTH

## 2024-08-19 ENCOUNTER — Ambulatory Visit: Payer: Self-pay

## 2024-09-02 ENCOUNTER — Encounter: Payer: Self-pay | Admitting: Urology

## 2024-09-04 ENCOUNTER — Other Ambulatory Visit: Payer: Self-pay

## 2024-09-04 DIAGNOSIS — Z87440 Personal history of urinary (tract) infections: Secondary | ICD-10-CM

## 2024-09-05 ENCOUNTER — Other Ambulatory Visit

## 2024-09-09 ENCOUNTER — Other Ambulatory Visit

## 2024-09-09 DIAGNOSIS — Z87440 Personal history of urinary (tract) infections: Secondary | ICD-10-CM

## 2024-09-10 ENCOUNTER — Telehealth: Payer: Self-pay

## 2024-09-10 LAB — MICROSCOPIC EXAMINATION
RBC, Urine: >30 /HPF — AB (ref 0–2)
WBC, UA: >30 /HPF — AB (ref 0–5)

## 2024-09-10 LAB — URINALYSIS, ROUTINE W REFLEX MICROSCOPIC
Bilirubin, UA: NEGATIVE
Glucose, UA: NEGATIVE
Ketones, UA: NEGATIVE
Nitrite, UA: NEGATIVE
Specific Gravity, UA: 1.02 (ref 1.005–1.030)
Urobilinogen, Ur: 1 mg/dL (ref 0.2–1.0)
pH, UA: 6.5 (ref 5.0–7.5)

## 2024-09-10 NOTE — Telephone Encounter (Signed)
 Patient presents today with complaints of Hematuria .  UA and Culture done today.  Dr. Nieves  reviewed results and No treatment and pt need a Cystoscopy .  Patient aware of MD recommendations and that we will reach out with culture results.      Dyjwpvlj, CMA

## 2024-09-12 LAB — URINE CULTURE

## 2024-09-16 ENCOUNTER — Telehealth: Payer: Self-pay

## 2024-09-16 ENCOUNTER — Ambulatory Visit: Payer: Self-pay

## 2024-09-16 DIAGNOSIS — B379 Candidiasis, unspecified: Secondary | ICD-10-CM

## 2024-09-16 MED ORDER — FLUCONAZOLE 100 MG PO TABS: 100.0000 mg | ORAL_TABLET | Freq: Every day | ORAL | 0 refills | Status: AC

## 2024-09-16 NOTE — Telephone Encounter (Signed)
 SABRA

## 2024-09-16 NOTE — Telephone Encounter (Signed)
 Tried calling pt about urine culture results and the present of yeast in his urine with no answer. LVM for return call

## 2024-09-17 NOTE — Telephone Encounter (Signed)
 Open in error

## 2024-09-17 NOTE — Telephone Encounter (Signed)
" ° °  Return call to pt to make him aware of MD response. Pt state's he has a kidney stone and need pain medication. Pt is aware a message will be sent to MD. Voiced understanding "

## 2024-11-01 ENCOUNTER — Encounter: Payer: Self-pay | Admitting: Urology

## 2024-11-01 ENCOUNTER — Ambulatory Visit: Admitting: Urology

## 2024-11-01 VITALS — BP 125/71 | HR 71

## 2024-11-01 DIAGNOSIS — R300 Dysuria: Secondary | ICD-10-CM

## 2024-11-01 LAB — MICROSCOPIC EXAMINATION

## 2024-11-01 LAB — URINALYSIS, ROUTINE W REFLEX MICROSCOPIC
Bilirubin, UA: NEGATIVE
Ketones, UA: NEGATIVE
Nitrite, UA: NEGATIVE
Specific Gravity, UA: 1.015 (ref 1.005–1.030)
Urobilinogen, Ur: 4 mg/dL — ABNORMAL HIGH (ref 0.2–1.0)
pH, UA: 7 (ref 5.0–7.5)

## 2024-11-01 MED ORDER — CIPROFLOXACIN HCL 500 MG PO TABS: 500.0000 mg | ORAL_TABLET | Freq: Once | ORAL | Status: AC

## 2024-11-01 NOTE — Progress Notes (Unsigned)
" ° °  11/01/24  CC: Dysuria   HPI: Thomas Alexander is a 53yo here for cystoscopy for dysuria.  Blood pressure 125/71, pulse 71. NED. A&Ox3.   No respiratory distress   Abd soft, NT, ND Normal phallus with bilateral descended testicles  Cystoscopy Procedure Note  Patient identification was confirmed, informed consent was obtained, and patient was prepped using Betadine solution.  Lidocaine  jelly was administered per urethral meatus.     Pre-Procedure: - Inspection reveals a normal caliber ureteral meatus.  Procedure: The flexible cystoscope was introduced without difficulty - No urethral strictures/lesions are present. - {Blank multiple:19197::Enlarged,Surgically absent,Normal} prostate *** - {Blank multiple:19197::Normal,Elevated,Tight} bladder neck - Bilateral ureteral orifices identified - Bladder mucosa  reveals no ulcers, tumors, or lesions - No bladder stones - No trabeculation  Retroflexion shows ***   Post-Procedure: - Patient tolerated the procedure well  Assessment/ Plan:   No follow-ups on file.  Belvie Clara, MD  "

## 2025-01-20 ENCOUNTER — Other Ambulatory Visit (HOSPITAL_COMMUNITY)

## 2025-01-31 ENCOUNTER — Ambulatory Visit: Admitting: Urology
